# Patient Record
Sex: Female | Born: 1985 | Race: Black or African American | Hispanic: No | Marital: Single | Smoking: Former smoker
Health system: Southern US, Community
[De-identification: ages and names within clinical notes are randomized; demographics above are authoritative.]

## PROBLEM LIST (undated history)

## (undated) ENCOUNTER — Inpatient Hospital Stay: Payer: Self-pay

## (undated) DIAGNOSIS — F329 Major depressive disorder, single episode, unspecified: Secondary | ICD-10-CM

## (undated) DIAGNOSIS — F32A Depression, unspecified: Secondary | ICD-10-CM

## (undated) DIAGNOSIS — Z789 Other specified health status: Secondary | ICD-10-CM

## (undated) HISTORY — DX: Major depressive disorder, single episode, unspecified: F32.9

## (undated) HISTORY — PX: WISDOM TOOTH EXTRACTION: SHX21

## (undated) HISTORY — DX: Depression, unspecified: F32.A

---
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
## 2009-05-17 ENCOUNTER — Emergency Department: Payer: Self-pay | Admitting: Emergency Medicine

## 2009-09-30 ENCOUNTER — Ambulatory Visit: Payer: Self-pay | Admitting: Internal Medicine

## 2013-01-27 ENCOUNTER — Emergency Department: Payer: Self-pay | Admitting: Emergency Medicine

## 2014-04-10 ENCOUNTER — Emergency Department: Payer: Self-pay | Admitting: Emergency Medicine

## 2014-12-22 LAB — OB RESULTS CONSOLE HIV ANTIBODY (ROUTINE TESTING): HIV: NONREACTIVE

## 2014-12-23 ENCOUNTER — Other Ambulatory Visit: Payer: Self-pay | Admitting: Obstetrics and Gynecology

## 2014-12-23 DIAGNOSIS — Z369 Encounter for antenatal screening, unspecified: Secondary | ICD-10-CM

## 2014-12-24 LAB — OB RESULTS CONSOLE ABO/RH: RH Type: POSITIVE

## 2014-12-24 LAB — OB RESULTS CONSOLE VARICELLA ZOSTER ANTIBODY, IGG: Varicella: IMMUNE

## 2014-12-24 LAB — OB RESULTS CONSOLE ANTIBODY SCREEN: ANTIBODY SCREEN: NEGATIVE

## 2014-12-24 LAB — OB RESULTS CONSOLE HEPATITIS B SURFACE ANTIGEN: Hepatitis B Surface Ag: NEGATIVE

## 2014-12-24 LAB — OB RESULTS CONSOLE RUBELLA ANTIBODY, IGM: Rubella: IMMUNE

## 2015-01-08 ENCOUNTER — Ambulatory Visit: Payer: Self-pay | Attending: Obstetrics and Gynecology

## 2015-01-08 ENCOUNTER — Ambulatory Visit: Payer: Self-pay

## 2015-03-30 ENCOUNTER — Observation Stay
Admit: 2015-03-30 | Discharge: 2015-03-30 | Discharge status: Against Medical Advice | Payer: Medicaid Other | Attending: Obstetrics and Gynecology | Admitting: Obstetrics and Gynecology

## 2015-03-30 ENCOUNTER — Emergency Department
Admission: EM | Admit: 2015-03-30 | Discharge: 2015-03-30 | Disposition: A | Discharge status: Home / Self Care | Payer: Medicaid Other | Attending: Emergency Medicine | Admitting: Emergency Medicine

## 2015-03-30 ENCOUNTER — Encounter: Payer: Self-pay | Admitting: Emergency Medicine

## 2015-03-30 DIAGNOSIS — Z3A24 24 weeks gestation of pregnancy: Secondary | ICD-10-CM | POA: Diagnosis not present

## 2015-03-30 DIAGNOSIS — O9A212 Injury, poisoning and certain other consequences of external causes complicating pregnancy, second trimester: Secondary | ICD-10-CM | POA: Diagnosis present

## 2015-03-30 DIAGNOSIS — Y9289 Other specified places as the place of occurrence of the external cause: Secondary | ICD-10-CM | POA: Insufficient documentation

## 2015-03-30 DIAGNOSIS — Z3490 Encounter for supervision of normal pregnancy, unspecified, unspecified trimester: Secondary | ICD-10-CM

## 2015-03-30 DIAGNOSIS — Y9389 Activity, other specified: Secondary | ICD-10-CM | POA: Diagnosis not present

## 2015-03-30 DIAGNOSIS — S61012A Laceration without foreign body of left thumb without damage to nail, initial encounter: Secondary | ICD-10-CM | POA: Insufficient documentation

## 2015-03-30 DIAGNOSIS — Y998 Other external cause status: Secondary | ICD-10-CM | POA: Insufficient documentation
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
## 2015-03-30 NOTE — Progress Notes (Signed)
Patient requests to [facility]. AMA forms signed and patient informed of risks of leaving against medical advice. Patient not in distress and left ambulatory with significant other. Kalifa Cadden notified.
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
## 2015-06-09 ENCOUNTER — Encounter: Payer: Self-pay | Admitting: Emergency Medicine

## 2015-06-09 ENCOUNTER — Emergency Department
Admission: EM | Admit: 2015-06-09 | Discharge: 2015-06-09 | Disposition: A | Discharge status: Home / Self Care | Payer: Medicaid Other | Attending: Emergency Medicine | Admitting: Emergency Medicine

## 2015-06-09 DIAGNOSIS — O212 Late vomiting of pregnancy: Secondary | ICD-10-CM | POA: Diagnosis not present

## 2015-06-09 DIAGNOSIS — J0100 Acute maxillary sinusitis, unspecified: Secondary | ICD-10-CM | POA: Insufficient documentation

## 2015-06-09 DIAGNOSIS — Z3A34 34 weeks gestation of pregnancy: Secondary | ICD-10-CM | POA: Insufficient documentation

## 2015-06-09 DIAGNOSIS — O99513 Diseases of the respiratory system complicating pregnancy, third trimester: Secondary | ICD-10-CM | POA: Insufficient documentation

## 2015-06-09 DIAGNOSIS — O9989 Other specified diseases and conditions complicating pregnancy, childbirth and the puerperium: Secondary | ICD-10-CM | POA: Diagnosis present

## 2015-06-09 DIAGNOSIS — B9689 Other specified bacterial agents as the cause of diseases classified elsewhere: Secondary | ICD-10-CM

## 2015-06-09 DIAGNOSIS — M545 Low back pain: Secondary | ICD-10-CM | POA: Diagnosis not present

## 2015-06-09 DIAGNOSIS — J329 Chronic sinusitis, unspecified: Secondary | ICD-10-CM

## 2015-06-09 MED ORDER — AMOXICILLIN-POT CLAVULANATE 500-125 MG PO TABS: 1.0000 | ORAL_TABLET | Freq: Three times a day (TID) | ORAL | Status: DC

## 2015-06-09 NOTE — ED Notes (Signed)
Patient denies pain and is resting comfortably. , c/o cough

## 2015-06-09 NOTE — Discharge Instructions (Signed)

## 2015-06-09 NOTE — ED Provider Notes (Signed)
[facility] Emergency Department Provider Note  ____________________________________________  Time seen: Approximately 12:33 PM  I have reviewed the triage vital signs and the nursing notes.   HISTORY  Chief Complaint Cough and URI    HPI Virginia [name] is a 29 y.o. female [redacted] weeks pregnant, who presents with 3 weeks of cough and nasal congestion. 3 weeks ago she first developed a sore throat which has now resolved; however she continues to have a productive cough with yellow phlegm, nasal congestion worse on the left than the right. She reports occasional nosebleeds, as well as night sweats, but has not measured her temperature at home. She denies chills, hemoptysis, headache, vision changes, neck pain, tachycardia, or palpitations. She has tried Mucinex, Robitussin without alcohol, cough drops, and Vapor Rub with no relief. She tried a nasal spray for 2 days but was told to discontinue by her nurse midwife at [facility]. She also mentions low back pain that may be associated with her pregnancy.  She mentions that yesterday her midwife heard wheezing, and told her to come to the ER possibly for a breathing treatment or an x-ray to see if she has pneumonia. However, I reviewed the note that the patient brought with her, which reports no wheezing, and says to come to the ER if her symptoms worsen. The patient has no history of asthma or inhaler use. She is hoping for some sort of breathing treatment or something to help her "breathe better at night."   History reviewed. No pertinent past medical history.  There are no active problems to display for this patient.   History reviewed. No pertinent past surgical history.  Current Outpatient Rx  Name  Route  Sig  Dispense  Refill  . amoxicillin-clavulanate (AUGMENTIN) 500-125 MG tablet   Oral   Take 1 tablet (500 mg total) by mouth 3 (three) times daily.   20 tablet   0     Allergies Review of patient's allergies  indicates no known allergies.  No family history on file.  Social History Social History  Substance Use Topics  . Smoking status: Never Smoker   . Smokeless tobacco: None  . Alcohol Use: None    Review of Systems Constitutional: No fever/chills; reports occasional night sweats Eyes: No visual changes. ENT: Sore throat has resolved; no neck pain; reports nasal congestion; no ear drainage Cardiovascular: Denies chest pain. Respiratory: Reports shortness of breath. Gastrointestinal: Occasional vomiting; No abdominal pain.  No nausea.  No diarrhea.  No constipation. Genitourinary: Negative for dysuria. Musculoskeletal: Positive for low back pain. Skin: Negative for rash. Neurological: Negative for headaches, focal weakness or numbness.  10-point ROS otherwise negative.  ____________________________________________   PHYSICAL EXAM:  VITAL SIGNS: ED Triage Vitals  Enc Vitals Group     BP 06/09/15 1203 120/70 mmHg     Pulse Rate 06/09/15 1203 80     Resp 06/09/15 1203 18     Temp 06/09/15 1203 98.5 F (36.9 C)     Temp Source 06/09/15 1203 Oral     SpO2 06/09/15 1203 98 %     Weight 06/09/15 1203 219 lb (99.338 kg)     Height 06/09/15 1203  (1.549 m)     Head Cir --      Peak Flow --      Pain Score 06/09/15 1204 4     Pain Loc --      Pain Edu? --      Excl. in GC? --  Constitutional: Alert and oriented. Well appearing and in no acute distress. Eyes: Conjunctivae are normal. PERRL. EOMI. Head: Atraumatic. Nose: Swollen nasal turbinates; tender to palpation of the maxillary sinuses on the left than the right No congestion/rhinnorhea. Mouth/Throat: Mucous membranes are moist.  Oropharynx shows cobblestoning. Neck: No stridor.   Hematological/Lymphatic/Immunilogical: Positive for cervical lymphadenopathy. Cardiovascular: Normal rate, regular rhythm. Grossly normal heart sounds.  Good peripheral circulation. Respiratory: Normal respiratory effort.  No  retractions. Lungs CTAB. No wheezes. Gastrointestinal: Soft and nontender. No distention. Neurologic:  Normal speech and language. No gross focal neurologic deficits are appreciated. No gait instability. Skin:  Skin is warm, dry and intact. No rash noted. Psychiatric: Mood and affect are normal. Speech and behavior are normal.  ____________________________________________   LABS (all labs ordered are listed, but only abnormal results are displayed)  Labs Reviewed - No data to display ____________________________________________  EKG  None ____________________________________________  RADIOLOGY  None ____________________________________________   PROCEDURES  Procedure(s) performed: None  Critical Care performed: No  ____________________________________________   INITIAL IMPRESSION / ASSESSMENT AND PLAN / ED COURSE  Pertinent labs & imaging results that were available during my care of the patient were reviewed by me and considered in my medical decision making (see chart for details).  Acute bacterial sinusitis, given 3 week course, unilateral maxillary sinus pressure, and immunosuppression in pregnancy. Will prescribe Amoxicillin-clavulanate, Mucinex; counseled that a breathing treatment carries more risk than benefit at this time, especially given clear lung sounds and SpO2. ____________________________________________   FINAL CLINICAL IMPRESSION(S) / ED DIAGNOSES  Final diagnoses:  Sinusitis, bacterial  Acute maxillary sinusitis, recurrence not specified     Evangeline Dakinharles M Tequia Wolman, PA-C 06/09/15 1437  Sharman CheekPhillip Stafford, MD 06/09/15 1538

## 2015-06-09 NOTE — ED Notes (Signed)
Cough and chest congestion, wheezing x 3 weeks.  [redacted] weeks pregnant, referred to ED by PCP.  Has been taking mucinex and robitussion, but symptoms not improving.

## 2015-06-18 ENCOUNTER — Encounter: Payer: Self-pay | Admitting: *Deleted

## 2015-06-18 ENCOUNTER — Observation Stay
Admission: EM | Admit: 2015-06-18 | Discharge: 2015-06-18 | Disposition: A | Discharge status: Home / Self Care | Payer: Medicaid Other | Attending: Obstetrics and Gynecology | Admitting: Obstetrics and Gynecology

## 2015-06-18 DIAGNOSIS — M549 Dorsalgia, unspecified: Secondary | ICD-10-CM | POA: Diagnosis present

## 2015-06-18 DIAGNOSIS — Z3A36 36 weeks gestation of pregnancy: Secondary | ICD-10-CM | POA: Diagnosis not present

## 2015-06-18 DIAGNOSIS — O26893 Other specified pregnancy related conditions, third trimester: Principal | ICD-10-CM | POA: Insufficient documentation

## 2015-06-18 HISTORY — DX: Other specified health status: Z78.9

## 2015-06-18 MED ORDER — CALCIUM CARBONATE ANTACID 500 MG PO CHEW: 2.0000 | CHEWABLE_TABLET | ORAL | Status: DC | PRN

## 2015-06-18 MED ORDER — ACETAMINOPHEN 325 MG PO TABS: 650.0000 mg | ORAL_TABLET | ORAL | Status: DC | PRN

## 2015-06-18 NOTE — OB Triage Note (Signed)
Patient states she has been leaking a small amount of clear fluid since 1200 and has had "off and on" back pain for the past day

## 2015-06-18 NOTE — Discharge Instructions (Signed)
Drink plenty of fluid get plenty of rest, call your provider for any other concerns.

## 2015-06-22 LAB — OB RESULTS CONSOLE GBS: STREP GROUP B AG: POSITIVE

## 2015-06-22 LAB — OB RESULTS CONSOLE GC/CHLAMYDIA
CHLAMYDIA, DNA PROBE: NEGATIVE
GC PROBE AMP, GENITAL: NEGATIVE

## 2015-07-01 LAB — OB RESULTS CONSOLE RPR: RPR: NONREACTIVE

## 2015-07-07 ENCOUNTER — Observation Stay
Admission: EM | Admit: 2015-07-07 | Discharge: 2015-07-07 | Disposition: A | Discharge status: Home / Self Care | Payer: Medicaid Other | Attending: Obstetrics and Gynecology | Admitting: Obstetrics and Gynecology

## 2015-07-07 DIAGNOSIS — Z3A40 40 weeks gestation of pregnancy: Secondary | ICD-10-CM | POA: Insufficient documentation

## 2015-07-07 MED ORDER — OXYCODONE-ACETAMINOPHEN 5-325 MG PO TABS
1.0000 | ORAL_TABLET | ORAL | Status: DC | PRN
  Administered 2015-07-07: 1 via ORAL

## 2015-07-07 MED ORDER — OXYCODONE-ACETAMINOPHEN 5-325 MG PO TABS
ORAL_TABLET | ORAL | Status: AC
  Administered 2015-07-07: 1 via ORAL
  Filled 2015-07-07: qty 1

## 2015-07-07 NOTE — OB Triage Note (Signed)
Pt presents to L&D with c/o contractions, back pain and pressure.reports good fetal movement and Denies vaginal bleeding. efm and toco applied and explained, plan to observe fetal and maternal well being and assess for labor

## 2015-07-07 NOTE — Discharge Instructions (Signed)
Call provider or return to birthplace with: ° °1. Strong regular contractions every 5 minutes. °2. Leaking of fluid from your vagina °3. Vaginal bleeding: Bright red or heavy like a period °4. Decreased Fetal movement ° °

## 2015-07-11 ENCOUNTER — Other Ambulatory Visit: Payer: Self-pay | Admitting: Obstetrics and Gynecology

## 2015-07-11 ENCOUNTER — Telehealth: Payer: Self-pay | Admitting: Obstetrics and Gynecology

## 2015-07-11 NOTE — Telephone Encounter (Signed)
Pt had UTI and tx with Macrobid 100 mg po BID x 7 days. On Day 6, she presented back to clinic and another urine was sent. That showed the infection was still present. Disc with Carlean JewsMeredith Sigmon, CNM and she wanted to cover the pt if needed. The pt states she had improvement in symptoms in 3 days and no further symptoms. Since it is a holiday and office is closed, will see pt on Tues for a urine to see if we need to restart ABX. Pt agreed

## 2015-07-17 ENCOUNTER — Observation Stay
Admission: EM | Admit: 2015-07-17 | Discharge: 2015-07-17 | Disposition: A | Discharge status: Home / Self Care | Payer: Medicaid Other | Source: Home / Self Care | Admitting: Obstetrics and Gynecology

## 2015-07-17 DIAGNOSIS — Z3490 Encounter for supervision of normal pregnancy, unspecified, unspecified trimester: Secondary | ICD-10-CM

## 2015-07-17 MED ORDER — OXYCODONE-ACETAMINOPHEN 5-325 MG PO TABS
1.0000 | ORAL_TABLET | Freq: Once | ORAL | Status: AC
  Administered 2015-07-17: 1 via ORAL

## 2015-07-17 MED ORDER — OXYCODONE-ACETAMINOPHEN 5-325 MG PO TABS
ORAL_TABLET | ORAL | Status: AC
  Administered 2015-07-17: 1 via ORAL
  Filled 2015-07-17: qty 1

## 2015-07-17 NOTE — OB Triage Note (Signed)
Pt arrived to Obs rm 2 from ED with C/O contractions and possible water breaking. Pt placed on monitors and oriented to room.

## 2015-07-17 NOTE — Discharge Instructions (Signed)
Get plenty of rest and drink plenty of water

## 2015-07-18 ENCOUNTER — Inpatient Hospital Stay
Admission: EM | Admit: 2015-07-18 | Discharge: 2015-07-22 | DRG: 765 | Disposition: A | Discharge status: Home / Self Care | Payer: Medicaid Other | Attending: Obstetrics and Gynecology | Admitting: Obstetrics and Gynecology

## 2015-07-18 ENCOUNTER — Inpatient Hospital Stay: Payer: Medicaid Other | Admitting: Anesthesiology

## 2015-07-18 DIAGNOSIS — O411290 Chorioamnionitis, unspecified trimester, not applicable or unspecified: Secondary | ICD-10-CM | POA: Diagnosis present

## 2015-07-18 DIAGNOSIS — Z87891 Personal history of nicotine dependence: Secondary | ICD-10-CM

## 2015-07-18 DIAGNOSIS — O99824 Streptococcus B carrier state complicating childbirth: Secondary | ICD-10-CM | POA: Diagnosis present

## 2015-07-18 DIAGNOSIS — Z6841 Body Mass Index (BMI) 40.0 and over, adult: Secondary | ICD-10-CM

## 2015-07-18 DIAGNOSIS — Z3A40 40 weeks gestation of pregnancy: Secondary | ICD-10-CM

## 2015-07-18 DIAGNOSIS — O8612 Endometritis following delivery: Secondary | ICD-10-CM | POA: Diagnosis not present

## 2015-07-18 DIAGNOSIS — O329XX0 Maternal care for malpresentation of fetus, unspecified, not applicable or unspecified: Secondary | ICD-10-CM | POA: Diagnosis present

## 2015-07-18 DIAGNOSIS — O99210 Obesity complicating pregnancy, unspecified trimester: Secondary | ICD-10-CM | POA: Diagnosis present

## 2015-07-18 DIAGNOSIS — O480 Post-term pregnancy: Secondary | ICD-10-CM | POA: Diagnosis present

## 2015-07-18 DIAGNOSIS — O23593 Infection of other part of genital tract in pregnancy, third trimester: Secondary | ICD-10-CM | POA: Diagnosis present

## 2015-07-18 DIAGNOSIS — O99214 Obesity complicating childbirth: Secondary | ICD-10-CM | POA: Diagnosis present

## 2015-07-18 DIAGNOSIS — E669 Obesity, unspecified: Secondary | ICD-10-CM | POA: Diagnosis present

## 2015-07-18 DIAGNOSIS — O4292 Full-term premature rupture of membranes, unspecified as to length of time between rupture and onset of labor: Secondary | ICD-10-CM | POA: Diagnosis present

## 2015-07-18 DIAGNOSIS — Z98891 History of uterine scar from previous surgery: Secondary | ICD-10-CM

## 2015-07-18 LAB — CBC
HEMATOCRIT: 39.8 % (ref 35.0–47.0)
Hemoglobin: 13.4 g/dL (ref 12.0–16.0)
MCH: 29.3 pg (ref 26.0–34.0)
MCHC: 33.7 g/dL (ref 32.0–36.0)
MCV: 87.1 fL (ref 80.0–100.0)
PLATELETS: 194 10*3/uL (ref 150–440)
RBC: 4.57 MIL/uL (ref 3.80–5.20)
RDW: 13.5 % (ref 11.5–14.5)
WBC: 10.5 10*3/uL (ref 3.6–11.0)

## 2015-07-18 LAB — RPR: RPR Ser Ql: NONREACTIVE

## 2015-07-18 LAB — TYPE AND SCREEN
ABO/RH(D): B POS
ANTIBODY SCREEN: NEGATIVE

## 2015-07-18 LAB — ABO/RH: ABO/RH(D): B POS

## 2015-07-18 MED ORDER — MISOPROSTOL 200 MCG PO TABS
ORAL_TABLET | ORAL | Status: AC
  Filled 2015-07-18: qty 4

## 2015-07-18 MED ORDER — ACETAMINOPHEN 325 MG PO TABS: 650.0000 mg | ORAL_TABLET | ORAL | Status: DC | PRN

## 2015-07-18 MED ORDER — LIDOCAINE HCL (PF) 1 % IJ SOLN
INTRAMUSCULAR | Status: AC
  Filled 2015-07-18: qty 30

## 2015-07-18 MED ORDER — OXYTOCIN BOLUS FROM INFUSION: 500.0000 mL | INTRAVENOUS | Status: DC

## 2015-07-18 MED ORDER — NALBUPHINE HCL 10 MG/ML IJ SOLN: 5.0000 mg | Freq: Once | INTRAMUSCULAR | Status: DC | PRN

## 2015-07-18 MED ORDER — BUTORPHANOL TARTRATE 1 MG/ML IJ SOLN
1.0000 mg | INTRAMUSCULAR | Status: DC | PRN
  Administered 2015-07-18: 1 mg via INTRAVENOUS
  Filled 2015-07-18: qty 1

## 2015-07-18 MED ORDER — AMMONIA AROMATIC IN INHA
RESPIRATORY_TRACT | Status: AC
  Filled 2015-07-18: qty 10

## 2015-07-18 MED ORDER — OXYTOCIN 40 UNITS IN LACTATED RINGERS INFUSION - SIMPLE MED
1.0000 m[IU]/min | INTRAVENOUS | Status: DC
  Administered 2015-07-18: 1 m[IU]/min via INTRAVENOUS

## 2015-07-18 MED ORDER — SODIUM CHLORIDE 0.9 % IV SOLN
INTRAVENOUS | Status: DC | PRN
  Administered 2015-07-18 (×2): 5 mL via EPIDURAL

## 2015-07-18 MED ORDER — LIDOCAINE-EPINEPHRINE (PF) 1.5 %-1:200000 IJ SOLN
INTRAMUSCULAR | Status: DC | PRN
  Administered 2015-07-18: 3 mL via EPIDURAL

## 2015-07-18 MED ORDER — OXYTOCIN 40 UNITS IN LACTATED RINGERS INFUSION - SIMPLE MED: 1.0000 m[IU]/min | INTRAVENOUS | Status: DC

## 2015-07-18 MED ORDER — FENTANYL 2.5 MCG/ML W/ROPIVACAINE 0.2% IN NS 100 ML EPIDURAL INFUSION (ARMC-ANES)
10.0000 mL/h | EPIDURAL | Status: DC
  Administered 2015-07-18 – 2015-07-19 (×3): 10 mL/h via EPIDURAL
  Filled 2015-07-18 (×3): qty 100

## 2015-07-18 MED ORDER — NALBUPHINE HCL 10 MG/ML IJ SOLN: 5.0000 mg | INTRAMUSCULAR | Status: DC | PRN

## 2015-07-18 MED ORDER — KETOROLAC TROMETHAMINE 30 MG/ML IJ SOLN: 30.0000 mg | Freq: Four times a day (QID) | INTRAMUSCULAR | Status: AC | PRN

## 2015-07-18 MED ORDER — SODIUM CHLORIDE 0.9 % IV SOLN
1.0000 g | INTRAVENOUS | Status: DC
  Administered 2015-07-18 – 2015-07-19 (×8): 1 g via INTRAVENOUS
  Filled 2015-07-18 (×13): qty 1000

## 2015-07-18 MED ORDER — OXYTOCIN 10 UNIT/ML IJ SOLN
INTRAMUSCULAR | Status: DC
Start: 2015-07-18 — End: 2015-07-19
  Filled 2015-07-18: qty 2

## 2015-07-18 MED ORDER — DIPHENHYDRAMINE HCL 25 MG PO CAPS: 25.0000 mg | ORAL_CAPSULE | ORAL | Status: DC | PRN

## 2015-07-18 MED ORDER — NALOXONE HCL 2 MG/2ML IJ SOSY
1.0000 ug/kg/h | PREFILLED_SYRINGE | INTRAVENOUS | Status: DC | PRN
  Filled 2015-07-18: qty 2

## 2015-07-18 MED ORDER — DIPHENHYDRAMINE HCL 50 MG/ML IJ SOLN: 12.5000 mg | INTRAMUSCULAR | Status: DC | PRN

## 2015-07-18 MED ORDER — OXYTOCIN 40 UNITS IN LACTATED RINGERS INFUSION - SIMPLE MED
2.5000 [IU]/h | INTRAVENOUS | Status: DC
  Filled 2015-07-18: qty 1000

## 2015-07-18 MED ORDER — SODIUM CHLORIDE 0.9 % IV SOLN
2.0000 g | Freq: Once | INTRAVENOUS | Status: AC
  Administered 2015-07-18: 2 g via INTRAVENOUS
  Filled 2015-07-18: qty 2000

## 2015-07-18 MED ORDER — MEPERIDINE HCL 25 MG/ML IJ SOLN: 6.2500 mg | INTRAMUSCULAR | Status: DC | PRN

## 2015-07-18 MED ORDER — FAMOTIDINE 20 MG PO TABS
10.0000 mg | ORAL_TABLET | Freq: Every day | ORAL | Status: DC
  Administered 2015-07-18 – 2015-07-22 (×4): 10 mg via ORAL
  Filled 2015-07-18 (×4): qty 1

## 2015-07-18 MED ORDER — FENTANYL 2.5 MCG/ML W/ROPIVACAINE 0.2% IN NS 100 ML EPIDURAL INFUSION (ARMC-ANES)
EPIDURAL | Status: AC
  Administered 2015-07-18: 10 mL/h via EPIDURAL
  Filled 2015-07-18: qty 100

## 2015-07-18 MED ORDER — LACTATED RINGERS IV SOLN
INTRAVENOUS | Status: DC
  Administered 2015-07-18 – 2015-07-19 (×4): via INTRAVENOUS

## 2015-07-18 MED ORDER — SODIUM CHLORIDE 0.9 % IJ SOLN: 3.0000 mL | INTRAMUSCULAR | Status: DC | PRN

## 2015-07-18 MED ORDER — SODIUM CHLORIDE 0.9 % IJ SOLN
INTRAMUSCULAR | Status: AC
  Filled 2015-07-18: qty 10

## 2015-07-18 MED ORDER — CITRIC ACID-SODIUM CITRATE 334-500 MG/5ML PO SOLN
30.0000 mL | ORAL | Status: DC | PRN
Start: 2015-07-18 — End: 2015-07-19
  Filled 2015-07-18: qty 15

## 2015-07-18 MED ORDER — ONDANSETRON HCL 4 MG/2ML IJ SOLN
4.0000 mg | Freq: Four times a day (QID) | INTRAMUSCULAR | Status: DC | PRN
Start: 2015-07-18 — End: 2015-07-19

## 2015-07-18 MED ORDER — NALOXONE HCL 0.4 MG/ML IJ SOLN: 0.4000 mg | INTRAMUSCULAR | Status: DC | PRN

## 2015-07-18 MED ORDER — ONDANSETRON HCL 4 MG/2ML IJ SOLN: 4.0000 mg | Freq: Three times a day (TID) | INTRAMUSCULAR | Status: DC | PRN

## 2015-07-18 MED ORDER — LACTATED RINGERS IV SOLN
500.0000 mL | INTRAVENOUS | Status: DC | PRN
  Administered 2015-07-19: 14:00:00 via INTRAVENOUS

## 2015-07-18 NOTE — Progress Notes (Signed)
S: Resting comfortably with epidural. + CTX, no LOF, VB O: Filed Vitals:   07/18/15 0724 07/18/15 0820 07/18/15 0920 07/18/15 1001  BP:  119/72 123/76   Pulse:  78 98   Temp: 99.5 F (37.5 C) 98.6 F (37 C)  98.8 F (37.1 C)  TempSrc: Oral Axillary  Axillary  Resp:   16   Height:      Weight:      SpO2:        Gen: NAD, AAOx3      Abd: FNTTP      Ext: Non-tender, Nonedmeatous    FHT: + mod var + accelerations no decelerations TOCO: Q 7-8  min SVE: 3/80%/vtx-2/-1   A/P:  30 y.o. yo G1P0 at 3728w1d for SROM with mec for labor.   Labor: Latent phase, Pitocin at 3 mun/min  FWB: Reassuring Cat 1 tracing. EFW 7#14oz  GBS:+, ABX per orders  Contraception: Micronor   Sharee Pimplearon W Aziya Arena 10:12 AM

## 2015-07-18 NOTE — Anesthesia Procedure Notes (Signed)
Epidural Patient location during procedure: OB Start time: 07/18/2015 2:16 AM End time: 07/18/2015 2:26 AM  Staffing Anesthesiologist: Lenard SimmerKARENZ, Tallen Schnorr Performed by: anesthesiologist   Preanesthetic Checklist Completed: patient identified, site marked, surgical consent, pre-op evaluation, timeout performed, IV checked, risks and benefits discussed and monitors and equipment checked  Epidural Patient position: sitting Prep: ChloraPrep Patient monitoring: heart rate, continuous pulse ox and blood pressure Approach: midline Location: L4-L5 Injection technique: LOR saline  Needle:  Needle type: Tuohy  Needle gauge: 18 G Needle length: 9 cm and 9 Needle insertion depth: 5.5 cm Catheter type: closed end flexible Catheter size: 20 Guage Catheter at skin depth: 11 cm Test dose: negative and 1.5% lidocaine with Epi 1:200 K  Assessment Events: blood not aspirated, injection not painful, no injection resistance, negative IV test and no paresthesia  Additional Notes   Patient tolerated the insertion well without complications.Reason for block:procedure for pain

## 2015-07-18 NOTE — Discharge Summary (Signed)
Obstetric Discharge Summary   Patient ID: Virginia MorrowRuvonia L [name] MRN: [id number] DOB/AGE: 29/12/1985 28 y.o.   Date of Admission: 07/18/2015  Date of Discharge: 1/  /17  Admitting Diagnosis: Onset of Labor at 6735w1d  Secondary Diagnosis: Obesity  Mode of Delivery: LTCS for fail to progress     Discharge Diagnosis: LTCS for fail to progress, postpartum endometritis   Intrapartum Procedures: pitocin drip for augmentation   Post partum procedures: none  Complications: None   Brief Hospital Course   POD#3 (Cesarean Section): Virginia [name] is a G1P0 who underwent cesarean section on 07/19/15.  Patient had an uncomplicated surgery; for further details of this surgery, please refer to the operative note.  Patient had postpartum course complicated by endometritis, with Tmax of 102.6 on POD#1.  She was started on amp/gent with WBC trending down.  By time of discharge on POD#3, her pain was controlled on oral pain medications; she had appropriate lochia and was ambulating, voiding without difficulty, tolerating regular diet and passing flatus.   She was deemed stable for discharge to home.    Labs: CBC Latest Ref Rng 07/18/2015  WBC 3.6 - 11.0 K/uL 10.5  Hemoglobin 12.0 - 16.0 g/dL 78.213.4  Hematocrit 95.635.0 - 47.0 % 39.8  Platelets 150 - 440 K/uL 194   B POS  Physical exam:  Blood pressure 123/76, pulse 98, temperature 98.6 F (37 C), temperature source Axillary, resp. rate 16, height 5\' 1"  (1.549 m), weight 232 lb (105.235 kg), SpO2 99 %. General: alert and no distress, eyes non-icteric Heart: S1S2, RRR, No M/R/G. Lungs: CTA bilat, no W/R/R. Lochia: appropriate Abdomen: soft, NT Uterine Fundus: firm Incision: healing well, no significant drainage, no dehiscence, no significant erythema. Staples intact and On-Q pump intact. Extremities: No evidence of DVT seen on physical exam. No lower extremity edema.  Discharge Instructions: Per After Visit Summary. Activity: Advance as tolerated. Pelvic  rest for 6 weeks.  Also refer to After Visit Summary Diet: Regular Medications:   Medication List    ASK your doctor about these medications        amoxicillin-clavulanate 500-125 MG tablet  Commonly known as:  AUGMENTIN  Take 1 tablet (500 mg total) by mouth 3 (three) times daily.     multivitamin-prenatal 27-0.8 MG Tabs tablet  Take 1 tablet by mouth daily at 12 noon.       Outpatient follow up:  Postpartum contraception: Micronor  Discharged Condition: Stable  Discharged to: home    Newborn Data: Stable  Baby "Girl" named "Arman BogusJaniyah  Noelle"  Disposition:home with mother  Apgars: APGAR (1 MIN): 7 APGAR (5 MINS): 9 APGAR (10 MINS): 9  Baby Feeding: Breast  Sharee Pimplearon W Jones, CNM 07/18/2015

## 2015-07-18 NOTE — Progress Notes (Signed)
S: Resting comfortably with epiidural. + CTX, no LOF, VB. Pt is getting tired.  O: Filed Vitals:   07/18/15 1524 07/18/15 1620 07/18/15 1720 07/18/15 1821  BP:  116/58 124/72 121/48  Pulse:  77 77 80  Temp: 98.8 F (37.1 C)  98.8 F (37.1 C)   TempSrc: Axillary  Oral   Resp:  14 14 16   Height:      Weight:      SpO2:       Gen: NAD, AAOx3      Abd: FNTTP      Ext: Non-tender, Nonedmeatous    FHT: + mod var + accelerations no decelerations, Cat I, few early decels earlier TOCO: Q 2.5-3 min, 240 MVU's SVE: 5/90/vtx-1   A/P:  30 y.o. yo G1P0 at 2642w1d for Pitocin augmentation and prolonged latent phase of labor.   Labor: prolonged latent phase with increasing Pitocin up to 30 mun/min  FWB: Reassuring Cat 1 tracing.  GBS: pos  Contraception:Micronor  Dr Dalbert GarnetBeasley aware of pt status and agrees with plan of care   Sharee Pimplearon W Chance Karam 9:47 PM

## 2015-07-18 NOTE — Progress Notes (Signed)
Tomasita MorrowRuvonia L Mood is a 30 y.o. G1P0 at 4851w1d admitted with PROM fro meconium stained fluid on 07/17/15 at 2300, approx 24hrs ago.  Review of labor course notes the patient is not in active labor, despite pitocin titration. She is comfortable with her epidural and is afebrile. She has an IUPC with inadequate contractions.   FHT showed several late decelerations two hours ago that resolved with conservative measures. The baby's status now is reassuring.  Objective: BP 129/77 mmHg  Pulse 73  Temp(Src) 98.8 F (37.1 C) (Oral)  Resp 16  Ht 5\' 1"  (1.549 m)  Wt 105.235 kg (232 lb)  BMI 43.86 kg/m2  SpO2 99% I/O last 3 completed shifts: In: 249 [I.V.:249] Out: 775 [Urine:775]    FHT:  Cat I with mod var, +accels, no decels. UC:  Regular contractions with pitocin at 6728mu/min (maximun 30 per protocol) with inadequate mvus of 180.  SVE:   Dilation: 5 Effacement (%): 90 Station: -1 Exam by:: C.Jones,CNM  Labs: Lab Results  Component Value Date   WBC 10.5 07/18/2015   HGB 13.4 07/18/2015   HCT 39.8 07/18/2015   MCV 87.1 07/18/2015   PLT 194 07/18/2015    Assessment / Plan:  At 5 cm, the patient is not in active labor. [facility]'s [facility], and we will continuously monitor. There are no maternal or fetal signs of infection.  She is receiving Amp for GBS positivity.   Will continue induction with pitocin to maximum of 30 mu/min per protocol and hope for a vaginal delivery. Consideration for pitocin rest by CNM at bedside if clinically appropriate.   Will treat if signs of intra amniotic infection develop.   Patient is aware of clinical status.  Christeen DouglasBEASLEY, Janziel Hockett 07/18/2015, 10:07 PM

## 2015-07-18 NOTE — Progress Notes (Signed)
S: Resting comfortably withepidural. + CTX, no LOF, VB O: Filed Vitals:   07/18/15 1102 07/18/15 1120 07/18/15 1147 07/18/15 1220  BP:  124/68  112/65  Pulse:  81  80  Temp: 99 F (37.2 C)     TempSrc: Oral     Resp:  14 14 16   Height:      Weight:      SpO2:         Gen: NAD, AAOx3      Abd: FNTTP      Ext: Non-tender, Nonedmeatous    FHT +  mod var + accelerations no decelerations TOCO: Q 4 min, 40-50 MVU's, pt not in active labor yet SVE: not reexamined to prevent infection   A/P:  30 y.o. yo G1P0 at 6421w1d for delivery with augmentation.  Labor: Latent phase  FWB: Reassuring Cat 1 tracing. EFW 7#140z  GBS:+ ABX being given  Contraception: unknown   Sharee Pimplearon W Fina Heizer 12:33 PM

## 2015-07-18 NOTE — Progress Notes (Signed)
S: Resting comfortably with effectiive epidural. + CTX, no LOF, VB O: Filed Vitals:   07/18/15 1403 07/18/15 1420 07/18/15 1520 07/18/15 1524  BP:  122/72 125/85   Pulse:  90 100   Temp: 98.1 F (36.7 C)   98.8 F (37.1 C)  TempSrc: Axillary   Axillary  Resp:  15    Height:      Weight:      SpO2:       Gen: NAD, AAOx3      Abd: FNTTP      Ext: Non-tender, Nonedmeatous    FHT: +mod var + accelerations no decelerations, Cat I TOCO: Q min SVE: 4/90/vtx-1   A/P:  30 y.o. yo G1P0 at 5221w1d for  Delivery   Labor: latent phase and MVU's still ineffective at 180  FWB: Reassuring Cat 1 tracing.  GBS: + on ABX  Continue to monitor UC/FHT's  Pitocin at 20 mun/min   Sharee Pimplearon W Purl Claytor 3:40 PM

## 2015-07-18 NOTE — OB Triage Note (Signed)
Patient presented to L&D complaining of contractions that have lasted all of yesterday that have gotten worse throughout the day.  States she thinks her water broke around 2300. Nitrazine positive- meconium stained fluid present on peri pad.  Denies decreased fetal movement or vaginal bleeding.

## 2015-07-18 NOTE — Anesthesia Preprocedure Evaluation (Signed)
Anesthesia Evaluation  Patient identified by MRN, date of birth, ID band Patient awake    Reviewed: Allergy & Precautions, H&P , NPO status , Patient's Chart, lab work & pertinent test results, reviewed documented beta blocker date and time   History of Anesthesia Complications Negative for: history of anesthetic complications  Airway Mallampati: III  TM Distance: >3 FB Neck ROM: full    Dental no notable dental hx. (+) Teeth Intact   Pulmonary neg pulmonary ROS, former smoker,    Pulmonary exam normal breath sounds clear to auscultation       Cardiovascular Exercise Tolerance: Good negative cardio ROS Normal cardiovascular exam Rhythm:regular Rate:Normal     Neuro/Psych negative neurological ROS  negative psych ROS   GI/Hepatic negative GI ROS, Neg liver ROS,   Endo/Other  negative endocrine ROS  Renal/GU negative Renal ROS  negative genitourinary   Musculoskeletal   Abdominal   Peds  Hematology negative hematology ROS (+)   Anesthesia Other Findings Past Medical History:   Medical history non-contributory                             Reproductive/Obstetrics (+) Pregnancy                             Anesthesia Physical Anesthesia Plan  ASA: II  Anesthesia Plan: Epidural   Post-op Pain Management:    Induction:   Airway Management Planned:   Additional Equipment:   Intra-op Plan:   Post-operative Plan:   Informed Consent: I have reviewed the patients History and Physical, chart, labs and discussed the procedure including the risks, benefits and alternatives for the proposed anesthesia with the patient or authorized representative who has indicated his/her understanding and acceptance.   Dental Advisory Given  Plan Discussed with: Anesthesiologist, CRNA and Surgeon  Anesthesia Plan Comments:         Anesthesia Quick Evaluation

## 2015-07-18 NOTE — Progress Notes (Signed)
Dr Bernestine AmassBeasely called and aware of pt status and agreed with proceeding even though progress is slow. Pt is afebrile, fetal wellbeing is stable and position is OP and using peanut ball to rotate side to side to attempt to get the baby down in the pelvis. Pt also does not want a LTCS also. Her partner is getting frustrated with not having the baby yet. Considered halving the pitocin but, will try to use the side to side roll with the peanut ball. Will check pt at 2330.

## 2015-07-18 NOTE — H&P (Signed)
HISTORY AND PHYSICAL  HISTORY OF PRESENT ILLNESS: Ms. Virginia [name] is a 30 y.o. G1P0 at 4639w1d by LMP of 10/13/14 consistent with 20 week ultrasound with a pregnancy complicated by  Obesity presenting for labor S/S and SROM at 2300 with meconium noted.   She has been having contractions Q 4-5 and confirms leakage of fluid meconium stained,no  vaginal bleeding, or decreased fetal movement.   [facility]  Significant for Primip and Obeisty. Pt was a smoker of tobacco products but, quit with the pregnancy. Smoked 2.5 pack per year.   REVIEW OF SYSTEMS: A complete review of systems was performed and was specifically negative for headache, changes in vision, RUQ pain, shortness of breath, chest pain, lower extremity edema and dysuria.   HISTORY:  Past Medical History  Diagnosis Date  . Medical history non-contributory     Past Surgical History  Procedure Laterality Date  . No past surgeries      No current facility-administered medications on file prior to encounter.   Current Outpatient Prescriptions on File Prior to Encounter  Medication Sig Dispense Refill  . amoxicillin-clavulanate (AUGMENTIN) 500-125 MG tablet Take 1 tablet (500 mg total) by mouth 3 (three) times daily. 20 tablet 0  . Prenatal Vit-Fe Fumarate-FA (MULTIVITAMIN-PRENATAL) 27-0.8 MG TABS tablet Take 1 tablet by mouth daily at 12 noon.       No Known Allergies  OB History  Gravida Para Term Preterm AB SAB TAB Ectopic Multiple Living  1             # Outcome Date GA Lbr Len/2nd Weight Sex Delivery Anes PTL Lv  1 Current               Gynecologic History: BV + in pregnancy History of Abnormal Pap Smear: Neg History of STI: Neg  Social History  Substance Use Topics  . Smoking status: Former Games developermoker  . Smokeless tobacco: None  . Alcohol Use: No    PHYSICAL EXAM: Temp:  [98.1 F (36.7 C)-99.5 F (37.5 C)] 98.6 F (37 C) (01/07 0820) Pulse Rate:  [74-109] 78 (01/07 0820) Resp:  [14-36] 14 (01/07 0720) BP:  (109-151)/(61-88) 119/72 mmHg (01/07 0820) SpO2:  [98 %-100 %] 99 % (01/07 0330) Weight:  [232 lb (105.235 kg)] 232 lb (105.235 kg) (01/07 0104)  GENERAL: NAD AAOx3 CHEST:CTAB no increased work of breathing CV:RRR no appreciable murmurs, rubs, gallops ABDOMEN: gravid, nontender, EFW 7#14  by [place] EXTREMITIES:  Warm and well-perfused, nontender, nonedematous, 1+ DTRs 0clonus CERVIX: 3/ 80/vtx-1/-2  FHT:150s baseline with mod variability, +accelerations and no decelerations  Toco:q 7-8 mins  DIAGNOSTIC STUDIES:  Recent Labs Lab 07/18/15 0112  WBC 10.5  HGB 13.4  HCT 39.8  PLT 194    PRENATAL STUDIES:  Prenatal Labs:  MBT:  B pos; Rubella immune, Varicella immune, HIV neg, RPR neg, Hep B neg, GC/CT neg, GBS pos glucola WNL  Last US  wks 19,placenta  Ant above the osl, normal anatomy, AFI WNL  ASSESSMENT AND PLAN:  1. Fetal Well being  - Fetal Tracing: reassuring - Ultrasound:  reviewed, as above - Group B Streptococcus: pos - Presentation: vtx confirmed by CNM  2. Routine OB: - Prenatal labs reviewed, as above - Rh B pos  3.  Labor: Needs augmentation -  Contractions internal toco in place -  Pelvis proven to none -  Plan for augmentation with Pitocin per protocol  4. Post Partum Planning: - Infant feeding: Breast - Contraception: unsure

## 2015-07-18 NOTE — Progress Notes (Addendum)
S: Resting comfortably with epidural. + CTX, no LOF, VB O: Filed Vitals:   07/18/15 1520 07/18/15 1524 07/18/15 1620 07/18/15 1720  BP: 125/85  116/58 124/72  Pulse: 100  77 77  Temp:  98.8 F (37.1 C)    TempSrc:  Axillary    Resp:   14 14  Height:      Weight:      SpO2:        Gen: NAD, AAOx3      Abd: FNTTP      Ext: Non-tender, Nonedmeatous    FHT: +marked to mod var + accelerations no decelerations, Cat I TOCO: Q 2  Min, 180 MVU'd. Will titrate Pitocin up to 30 mu/min SVE: 4-5/90/vtx-1   A/P:  30 y.o. yo G1P0 at 199w1d for delivery.  Labor: Pitocin augmentation at 20 mun/min  FWB: Reassuring Cat 1 tracing. EFW  7#14oz  GBS: pos  Continue to monitor UC/FHT and VS.  Increase Pitocin up to 30 mun/min  Dr Dalbert GarnetBeasley updated and aware of pt status and agrees with plan of care   Sharee Pimplearon W Alea Ryer 5:35 PM

## 2015-07-18 NOTE — Progress Notes (Signed)
S: Resting comfortably with effective epidural. + CTX, no LOF, VB O: Filed Vitals:   07/18/15 1524 07/18/15 1620 07/18/15 1720 07/18/15 1821  BP:  116/58 124/72 121/48  Pulse:  77 77 80  Temp: 98.8 F (37.1 C)  98.8 F (37.1 C)   TempSrc: Axillary  Oral   Resp:  14 14 16   Height:      Weight:      SpO2:       Gen: NAD, AAOx3      Abd: FNTTP      Ext: Non-tender, Nonedmeatous    FHT: +mod var + accelerations no decelerations, Cat I TOCO: Q 2-3 mins, MVU's 230 SVE: Last exam: 4-5/90%/vtx-1   A/P:  30 y.o. yo G1P0 at 3523w1d for delivery due to SROM and on Pitocin.    Labor: being augmented  FWB: Reassuring Cat 1 tracing. EFW 7#14oz  GBS: pos, ABX being given    Sharee Pimplearon W Jones 8:25 PM

## 2015-07-19 ENCOUNTER — Encounter
Admission: EM | Disposition: A | Discharge status: Home / Self Care | Payer: Self-pay | Source: Home / Self Care | Attending: Obstetrics and Gynecology

## 2015-07-19 ENCOUNTER — Encounter: Payer: Self-pay | Admitting: *Deleted

## 2015-07-19 DIAGNOSIS — Z98891 History of uterine scar from previous surgery: Secondary | ICD-10-CM

## 2015-07-19 LAB — URINALYSIS COMPLETE WITH MICROSCOPIC (ARMC ONLY)
BACTERIA UA: NONE SEEN
Bilirubin Urine: NEGATIVE
GLUCOSE, UA: NEGATIVE mg/dL
Leukocytes, UA: NEGATIVE
NITRITE: NEGATIVE
PROTEIN: NEGATIVE mg/dL
SPECIFIC GRAVITY, URINE: 1.01 (ref 1.005–1.030)
SQUAMOUS EPITHELIAL / LPF: NONE SEEN
pH: 7 (ref 5.0–8.0)

## 2015-07-19 LAB — CBC WITH DIFFERENTIAL/PLATELET
BASOS ABS: 0.1 10*3/uL (ref 0–0.1)
BASOS PCT: 0 %
Basophils Absolute: 0 10*3/uL (ref 0–0.1)
Basophils Relative: 1 %
EOS PCT: 0 %
Eosinophils Absolute: 0 10*3/uL (ref 0–0.7)
Eosinophils Absolute: 0 10*3/uL (ref 0–0.7)
Eosinophils Relative: 0 %
HEMATOCRIT: 38.7 % (ref 35.0–47.0)
HEMATOCRIT: 32.4 % — AB (ref 35.0–47.0)
HEMOGLOBIN: 13 g/dL (ref 12.0–16.0)
Hemoglobin: 10.7 g/dL — ABNORMAL LOW (ref 12.0–16.0)
LYMPHS ABS: 1.6 10*3/uL (ref 1.0–3.6)
LYMPHS PCT: 11 %
Lymphocytes Relative: 11 %
Lymphs Abs: 1.3 10*3/uL (ref 1.0–3.6)
MCH: 29.6 pg (ref 26.0–34.0)
MCH: 28.9 pg (ref 26.0–34.0)
MCHC: 33.6 g/dL (ref 32.0–36.0)
MCHC: 32.9 g/dL (ref 32.0–36.0)
MCV: 88.2 fL (ref 80.0–100.0)
MCV: 87.8 fL (ref 80.0–100.0)
MONO ABS: 1.1 10*3/uL — AB (ref 0.2–0.9)
MONOS PCT: 8 %
Monocytes Absolute: 1 10*3/uL — ABNORMAL HIGH (ref 0.2–0.9)
Monocytes Relative: 9 %
NEUTROS ABS: 9.1 10*3/uL — AB (ref 1.4–6.5)
NEUTROS ABS: 11.4 10*3/uL — AB (ref 1.4–6.5)
NEUTROS PCT: 79 %
Neutrophils Relative %: 81 %
PLATELETS: 187 10*3/uL (ref 150–440)
Platelets: 159 10*3/uL (ref 150–440)
RBC: 4.39 MIL/uL (ref 3.80–5.20)
RBC: 3.7 MIL/uL — ABNORMAL LOW (ref 3.80–5.20)
RDW: 13.7 % (ref 11.5–14.5)
RDW: 13.4 % (ref 11.5–14.5)
WBC: 11.5 10*3/uL — AB (ref 3.6–11.0)
WBC: 14.1 10*3/uL — ABNORMAL HIGH (ref 3.6–11.0)

## 2015-07-19 LAB — BASIC METABOLIC PANEL
ANION GAP: 7 (ref 5–15)
BUN: 7 mg/dL (ref 6–20)
CHLORIDE: 107 mmol/L (ref 101–111)
CO2: 23 mmol/L (ref 22–32)
Calcium: 8.3 mg/dL — ABNORMAL LOW (ref 8.9–10.3)
Creatinine, Ser: 0.79 mg/dL (ref 0.44–1.00)
Glucose, Bld: 111 mg/dL — ABNORMAL HIGH (ref 65–99)
POTASSIUM: 3.5 mmol/L (ref 3.5–5.1)
SODIUM: 137 mmol/L (ref 135–145)

## 2015-07-19 SURGERY — Surgical Case
Anesthesia: Epidural

## 2015-07-19 SURGERY — Surgical Case

## 2015-07-19 MED ORDER — SIMETHICONE 80 MG PO CHEW
80.0000 mg | CHEWABLE_TABLET | ORAL | Status: DC | PRN
  Administered 2015-07-20 – 2015-07-21 (×2): 80 mg via ORAL
  Filled 2015-07-19: qty 1

## 2015-07-19 MED ORDER — OXYTOCIN 40 UNITS IN LACTATED RINGERS INFUSION - SIMPLE MED: 1.0000 m[IU]/min | INTRAVENOUS | Status: DC

## 2015-07-19 MED ORDER — BUPIVACAINE HCL (PF) 0.5 % IJ SOLN
INTRAMUSCULAR | Status: AC
  Filled 2015-07-19: qty 30

## 2015-07-19 MED ORDER — GENTAMICIN SULFATE 40 MG/ML IJ SOLN
Freq: Three times a day (TID) | INTRAVENOUS | Status: DC
  Administered 2015-07-20 – 2015-07-21 (×5): via INTRAVENOUS
  Filled 2015-07-19 (×7): qty 4

## 2015-07-19 MED ORDER — FLEET ENEMA 7-19 GM/118ML RE ENEM: 1.0000 | ENEMA | Freq: Every day | RECTAL | Status: DC | PRN

## 2015-07-19 MED ORDER — MENTHOL 3 MG MT LOZG
1.0000 | LOZENGE | OROMUCOSAL | Status: DC | PRN
  Filled 2015-07-19: qty 9

## 2015-07-19 MED ORDER — GENTAMICIN SULFATE 40 MG/ML IJ SOLN
Freq: Once | INTRAVENOUS | Status: AC
  Administered 2015-07-19: 23:00:00 via INTRAVENOUS
  Filled 2015-07-19: qty 4

## 2015-07-19 MED ORDER — BUPIVACAINE 0.25 % ON-Q PUMP DUAL CATH 400 ML
400.0000 mL | INJECTION | Status: DC
  Filled 2015-07-19: qty 400

## 2015-07-19 MED ORDER — PRENATAL MULTIVITAMIN CH
1.0000 | ORAL_TABLET | Freq: Every day | ORAL | Status: DC
  Administered 2015-07-20 – 2015-07-22 (×3): 1 via ORAL
  Filled 2015-07-19 (×3): qty 1

## 2015-07-19 MED ORDER — BISACODYL 10 MG RE SUPP: 10.0000 mg | Freq: Every day | RECTAL | Status: DC | PRN

## 2015-07-19 MED ORDER — DIBUCAINE 1 % RE OINT: 1.0000 "application " | TOPICAL_OINTMENT | RECTAL | Status: DC | PRN

## 2015-07-19 MED ORDER — CITRIC ACID-SODIUM CITRATE 334-500 MG/5ML PO SOLN
30.0000 mL | Freq: Once | ORAL | Status: AC
  Administered 2015-07-19: 30 mL via ORAL

## 2015-07-19 MED ORDER — LANOLIN HYDROUS EX OINT: 1.0000 "application " | TOPICAL_OINTMENT | CUTANEOUS | Status: DC | PRN

## 2015-07-19 MED ORDER — BUPIVACAINE HCL (PF) 0.5 % IJ SOLN
INTRAMUSCULAR | Status: DC | PRN
  Administered 2015-07-19: 30 mL

## 2015-07-19 MED ORDER — OXYTOCIN 40 UNITS IN LACTATED RINGERS INFUSION - SIMPLE MED
1.0000 m[IU]/min | INTRAVENOUS | Status: DC
  Administered 2015-07-19: 100 mL via INTRAVENOUS
  Filled 2015-07-19 (×3): qty 1000

## 2015-07-19 MED ORDER — OXYCODONE HCL 5 MG PO TABS
5.0000 mg | ORAL_TABLET | ORAL | Status: DC | PRN
  Filled 2015-07-19: qty 1

## 2015-07-19 MED ORDER — LIDOCAINE HCL (PF) 2 % IJ SOLN
INTRAMUSCULAR | Status: DC | PRN
  Administered 2015-07-19 (×2): 5 mL via INTRADERMAL
  Administered 2015-07-19: 10 mL via INTRADERMAL
  Administered 2015-07-19: 5 mL via INTRADERMAL

## 2015-07-19 MED ORDER — CEFAZOLIN SODIUM-DEXTROSE 2-3 GM-% IV SOLR
2.0000 g | Freq: Once | INTRAVENOUS | Status: AC
  Administered 2015-07-19: 2 g via INTRAVENOUS
  Filled 2015-07-19: qty 50

## 2015-07-19 MED ORDER — SENNOSIDES-DOCUSATE SODIUM 8.6-50 MG PO TABS
2.0000 | ORAL_TABLET | ORAL | Status: DC
  Administered 2015-07-21 – 2015-07-22 (×2): 2 via ORAL
  Filled 2015-07-19 (×2): qty 2

## 2015-07-19 MED ORDER — OXYCODONE-ACETAMINOPHEN 5-325 MG PO TABS: 1.0000 | ORAL_TABLET | ORAL | Status: DC | PRN

## 2015-07-19 MED ORDER — OXYCODONE HCL 5 MG PO TABS
10.0000 mg | ORAL_TABLET | ORAL | Status: DC | PRN
Start: 2015-07-19 — End: 2015-07-22
  Administered 2015-07-20 – 2015-07-22 (×11): 10 mg via ORAL
  Filled 2015-07-19 (×11): qty 2

## 2015-07-19 MED ORDER — MORPHINE SULFATE (PF) 0.5 MG/ML IJ SOLN
INTRAMUSCULAR | Status: DC | PRN
  Administered 2015-07-19: 2 mg via EPIDURAL

## 2015-07-19 MED ORDER — ACETAMINOPHEN 325 MG PO TABS
650.0000 mg | ORAL_TABLET | ORAL | Status: DC | PRN
  Administered 2015-07-19 – 2015-07-22 (×7): 650 mg via ORAL
  Filled 2015-07-19 (×7): qty 2

## 2015-07-19 MED ORDER — SODIUM CHLORIDE 0.9 % IJ SOLN
INTRAMUSCULAR | Status: AC
  Filled 2015-07-19: qty 3

## 2015-07-19 MED ORDER — IBUPROFEN 400 MG PO TABS
600.0000 mg | ORAL_TABLET | Freq: Four times a day (QID) | ORAL | Status: DC
  Administered 2015-07-19 – 2015-07-22 (×11): 600 mg via ORAL
  Filled 2015-07-19: qty 2
  Filled 2015-07-19 (×10): qty 1

## 2015-07-19 MED ORDER — MORPHINE SULFATE (PF) 2 MG/ML IV SOLN
1.0000 mg | INTRAVENOUS | Status: DC | PRN
  Administered 2015-07-19 (×2): 2 mg via INTRAVENOUS
  Administered 2015-07-19 (×2): 1 mg via INTRAVENOUS
  Administered 2015-07-20 – 2015-07-21 (×5): 2 mg via INTRAVENOUS
  Filled 2015-07-19 (×8): qty 1

## 2015-07-19 MED ORDER — SIMETHICONE 80 MG PO CHEW
80.0000 mg | CHEWABLE_TABLET | Freq: Three times a day (TID) | ORAL | Status: DC
  Administered 2015-07-20 – 2015-07-22 (×7): 80 mg via ORAL
  Filled 2015-07-19 (×8): qty 1

## 2015-07-19 MED ORDER — PHENYLEPHRINE HCL 10 MG/ML IJ SOLN
INTRAMUSCULAR | Status: DC | PRN
  Administered 2015-07-19 (×5): 100 ug via INTRAVENOUS

## 2015-07-19 MED ORDER — OXYTOCIN 40 UNITS IN LACTATED RINGERS INFUSION - SIMPLE MED: 2.5000 [IU]/h | INTRAVENOUS | Status: AC

## 2015-07-19 MED ORDER — DIPHENHYDRAMINE HCL 25 MG PO CAPS: 25.0000 mg | ORAL_CAPSULE | Freq: Four times a day (QID) | ORAL | Status: DC | PRN

## 2015-07-19 MED ORDER — SIMETHICONE 80 MG PO CHEW
80.0000 mg | CHEWABLE_TABLET | ORAL | Status: DC
  Filled 2015-07-19: qty 1

## 2015-07-19 MED ORDER — WITCH HAZEL-GLYCERIN EX PADS: 1.0000 "application " | MEDICATED_PAD | CUTANEOUS | Status: DC | PRN

## 2015-07-19 MED ORDER — LACTATED RINGERS IV SOLN
INTRAVENOUS | Status: DC
  Administered 2015-07-19 – 2015-07-21 (×4): via INTRAVENOUS

## 2015-07-19 SURGICAL SUPPLY — 27 items
BARRIER ADHS 3X4 INTERCEED (GAUZE/BANDAGES/DRESSINGS) ×3 IMPLANT
BENZOIN TINCTURE PRP APPL 2/3 (GAUZE/BANDAGES/DRESSINGS) ×3 IMPLANT
CANISTER SUCT 3000ML (MISCELLANEOUS) ×3 IMPLANT
CATH KIT ON-Q SILVERSOAK 5IN (CATHETERS) ×3 IMPLANT
CHLORAPREP W/TINT 26ML (MISCELLANEOUS) ×3 IMPLANT
CLOSURE WOUND 1/2 X4 (GAUZE/BANDAGES/DRESSINGS) ×1
DRSG TELFA 3X8 NADH (GAUZE/BANDAGES/DRESSINGS) ×3 IMPLANT
GAUZE SPONGE 4X4 12PLY STRL (GAUZE/BANDAGES/DRESSINGS) ×3 IMPLANT
GOWN STRL REUS W/ TWL LRG LVL3 (GOWN DISPOSABLE) ×3 IMPLANT
GOWN STRL REUS W/TWL LRG LVL3 (GOWN DISPOSABLE) ×6
LIQUID BAND (GAUZE/BANDAGES/DRESSINGS) ×3 IMPLANT
NS IRRIG 1000ML POUR BTL (IV SOLUTION) ×3 IMPLANT
PAD GROUND ADULT SPLIT (MISCELLANEOUS) ×3 IMPLANT
PAD OB MATERNITY 4.3X12.25 (PERSONAL CARE ITEMS) ×3 IMPLANT
PAD PREP 24X41 OB/GYN DISP (PERSONAL CARE ITEMS) ×3 IMPLANT
RTRCTR C-SECT PINK 25CM LRG (MISCELLANEOUS) ×3 IMPLANT
STRIP CLOSURE SKIN 1/2X4 (GAUZE/BANDAGES/DRESSINGS) ×2 IMPLANT
SUT MNCRL 4-0 (SUTURE)
SUT MNCRL 4-0 27XMFL (SUTURE)
SUT PDS AB 1 TP1 96 (SUTURE) ×3 IMPLANT
SUT PLAIN 2 0 XLH (SUTURE) ×3 IMPLANT
SUT PLAIN GUT 2-0 30 C14 SG823 (SUTURE) ×3
SUT VIC AB 0 CT1 36 (SUTURE) ×9 IMPLANT
SUT VIC AB 3-0 SH 27 (SUTURE) ×2
SUT VIC AB 3-0 SH 27X BRD (SUTURE) ×1 IMPLANT
SUTURE MNCRL 4-0 27XMF (SUTURE) IMPLANT
SUTURE PLN GUT2-0 30 C14 SG823 (SUTURE) ×1 IMPLANT

## 2015-07-19 SURGICAL SUPPLY — 21 items
CANISTER SUCT 3000ML (MISCELLANEOUS) IMPLANT
CATH KIT ON-Q SILVERSOAK 5IN (CATHETERS) IMPLANT
CHLORAPREP W/TINT 26ML (MISCELLANEOUS) IMPLANT
DRSG TELFA 3X8 NADH (GAUZE/BANDAGES/DRESSINGS) IMPLANT
GAUZE SPONGE 4X4 12PLY STRL (GAUZE/BANDAGES/DRESSINGS) IMPLANT
GOWN STRL REUS W/ TWL LRG LVL3 (GOWN DISPOSABLE) IMPLANT
GOWN STRL REUS W/TWL LRG LVL3 (GOWN DISPOSABLE)
NS IRRIG 1000ML POUR BTL (IV SOLUTION) IMPLANT
PAD GROUND ADULT SPLIT (MISCELLANEOUS) IMPLANT
PAD OB MATERNITY 4.3X12.25 (PERSONAL CARE ITEMS) IMPLANT
PAD PREP 24X41 OB/GYN DISP (PERSONAL CARE ITEMS) IMPLANT
SUT MNCRL 4-0 (SUTURE)
SUT MNCRL 4-0 27XMFL (SUTURE)
SUT PDS AB 1 TP1 96 (SUTURE) IMPLANT
SUT PLAIN 2 0 XLH (SUTURE) IMPLANT
SUT PLAIN GUT 2-0 30 C14 SG823 (SUTURE)
SUT VIC AB 0 CT1 36 (SUTURE) IMPLANT
SUT VIC AB 3-0 SH 27 (SUTURE)
SUT VIC AB 3-0 SH 27X BRD (SUTURE) IMPLANT
SUTURE MNCRL 4-0 27XMF (SUTURE) IMPLANT
SUTURE PLN GUT2-0 30 C14 SG823 (SUTURE) IMPLANT

## 2015-07-19 NOTE — Progress Notes (Signed)
Called for elevated pt temp of T101.5 this pm. Will add Gentamycin (wt based dose) & Clindamycin 900 mg IV  Tid to cover in case of post partum endometritis. The uterus was not warm or have any sign of odor during the LTCS. Initially, the baby came out through the incision and there was a creamy yellow small amt that could have been pus vs. Amniotic fluid mixed with vernix. Baby also had lt brown meconium. Pt had an UTI and was on antibiotic coverage prior to L&D. Urine ordered, culture and sensitivity. WBC has gone from 11.5 to 14. 1. Peds aware and doing blood cultures on baby but, baby does not have a fever currently. Will cover with Natasha BenceGent and Clinda and since pt has just had surgery , will give IV till IV out. I went to see pt and pressed on her abd and she did not react except around the incision. Will see what blood cultures show.

## 2015-07-19 NOTE — Op Note (Addendum)
Cesarean Section Procedure Note  Date of procedure: 07/19/2015   Pre-operative Diagnosis: Intrauterine pregnancy at [redacted]w[redacted]d; arrest of dilation  Post-operative Diagnosis: same, delivered.  Procedure: Primary Low Transverse Cesarean Section through Pfannenstiel incision  Surgeon: Christeen Douglas, MD  Assistant(s):  Milon Score, CNM  Anesthesia: Epidural anesthesia and General Endotracheal with Epidural Regional  Estimated Blood Loss:  300 mL         Drains: On-Q pump         Total IV Fluids: 1200 ml  Urine Output: 200 ml         Specimens: Cord gas         Complications:  None; patient tolerated the procedure well.         Disposition: PACU - hemodynamically stable.         Condition: stable  Findings:  A female infant "Vincente Liberty" in direct OP presentation. Amniotic fluid - Meconium thick and gooy Birth weight 3190 g.  Apgars of 7 and 9 at one and five minutes respectively.  Intact placenta with a three-vessel cord.  Grossly normal uterus, tubes and ovaries bilaterally. No intraabdominal adhesions were noted.  Indications: failure to progress: arrest of dilation and malpresentation: direct OP  Pt admitted with occasional contractions after PROM on 07/17/15 at 2300 and was found to be 3 cm dilated. Oxytocin was started and reached a max of 30 mu/min without ever having adequate contractions. A pit rest overnight was given and she still only reached 6cm dilated at 36hrs post PROM. The decision was made to proceed with pLTCS for arrest of dilation at 6cm x6hrs and inadequate contractions for a day.  Procedure Details  The patient was taken to Operating Room, identified as the correct patient and the procedure verified as C-Section Delivery. A formal Time Out was held with all team members present and in agreement.  After induction of anesthesia, the patient was draped and prepped in the usual sterile manner. A Pfannenstiel skin incision was made and carried down through the  subcutaneous tissue to the fascia. Fascial incision was made and extended transversely with the Mayo scissors. The fascia was separated from the underlying rectus tissue superiorly and inferiorly. The peritoneum was identified and entered bluntly. Peritoneal incision was extended longitudinally. The utero-vesical peritoneal reflection was incised transversely and a bladder flap was created digitally. The Alexis retractor was placed.  A low transverse hysterotomy was made, with thick meconium protruding. The fetus was delivered atraumatically. The umbilical cord was clamped x2 and cut and the infant was handed to the awaiting pediatricians. The placenta was removed intact and appeared normal, intact, and with a 3-vessel cord.   The uterus was exteriorized and cleared of all clot and debris. The hysterotomy was closed with running sutures of 0-Vicryl. A second imbricating layer was placed with the same suture. Several figure of eights were placed to assure excellent hemostasis. The peritoneal cavity was cleared of all clots and debris. The uterus was returned to the abdomen. The On-Q pump was placed.  The pelvis was irrigated and again, excellent hemostasis was noted. The fascia was then reapproximated with running sutures of 0 Vicryl.  The subcutaneous tissue was reapproximated with running sutures of 0 vicryl. The skin was reapproximated with  staples.   Instrument, sponge, and needle counts were correct prior to the abdominal closure and at the conclusion of the case.   The patient tolerated the procedure well and was transferred to the recovery room in stable condition.   Christeen Douglas,  MD 07/19/2015

## 2015-07-19 NOTE — Transfer of Care (Signed)
Immediate Anesthesia Transfer of Care Note  Patient: Virginia [name]  Procedure(s) Performed: C-Section Patient Location: PACU  Anesthesia Type:Epidural  Level of Consciousness: alert   Airway & Oxygen Therapy: Patient Spontanous Breathing  Post-op Assessment: Report given to RN  Post vital signs: Reviewed  Last Vitals:  Filed Vitals:   07/19/15 1110 07/19/15 1249  BP: 138/72 127/69  Pulse: 102 98  Temp:    Resp: 14 16    Complications: No apparent anesthesia complications

## 2015-07-19 NOTE — Progress Notes (Signed)
Cx exam unchanged after 2 additional hours of Pitocin which has not given an active labor strip: UC's are q 3-4 mins with MVU's 20-25 mm. Pt does feel some pressure but, on exam the cx is starting to swell. A LTC S is being called and pt agreed with plan of care. Dr Henrene HawkingKephart (Anesthesia) called and made aware of need for lTCS. Due to non-emergent, OR just starting a case that will take an 1.5 hr and agreed that baby is reassuring on fetal strip and no medical indication except for "fail to progress". Dr Dalbert GarnetBeasley aware of pt status and agrees 100% with the plan. Partner remaining very silent and non-communicative. Will give Antibiotics.

## 2015-07-19 NOTE — Discharge Instructions (Signed)
Cesarean Delivery Cesarean delivery is the birth of a baby through a cut (incision) in the abdomen and womb (uterus).  LET YOUR HEALTH CARE PROVIDER KNOW ABOUT:  All medicines you are taking, including vitamins, herbs, eye drops, creams, and over-the-counter medicines.  Previous problems you or members of your family have had with the use of anesthetics.  Any bleeding or blood clotting disorders you have.  Family history of blood clots or bleeding disorders.  Any history of deep vein thrombosis (DVT) or pulmonary embolism (PE).  Previous surgeries you have had.  Medical conditions you have.  Any allergies you have.  Complicationsinvolving the pregnancy. RISKS AND COMPLICATIONS  Generally, this is a safe procedure. However, as with any procedure, complications can occur. Possible complications include:  Bleeding.  Infection.  Blood clots.  Injury to surrounding organs.  Problems with anesthesia.  Injury to the baby. BEFORE THE PROCEDURE   You may be given an antacid medicine to drink. This will prevent acid contents in your stomach from going into your lungs if you vomit during the surgery.  You may be given an antibiotic medicine to prevent infection. PROCEDURE   To prevent infection of your incision:  Hair may be removed from your pubic area if it is near your incision.  The skin of your pubic area and lower abdomen will be cleaned with a germ-killing solution (antiseptic).  A tube (Foley catheter) will be placed in your bladder to drain your urine from your bladder into a bag. This keeps your bladder empty during surgery.  An IV tube will be placed in your vein.  You may be given medicine to numb the lower half of your body (regional anesthetic). If you were in labor, you may have already had an epidural in place which can be used in both labor and cesarean delivery. You may possibly be given medicine to make you sleep (general anesthetic) though this is not as  common.  Your heart rate and your baby's heart rate will be monitored.  An incision will be made in your abdomen that extends to your uterus. There are 2 basic kinds of incisions:  The horizontal (transverse) incision. Horizontal incisions are from side to side and are used for most routine cesarean deliveries.  The vertical incision. The vertical incision is from the top of the abdomen to the bottom and is less commonly used. It is often done for women who have a serious complication (extreme prematurity) or under emergency situations.  The horizontal and vertical incisions may both be used at the same time. However, this is very uncommon.  An incision is then made in your uterus to deliver the baby.  Your baby will be delivered.  Your health care provider may place the baby on your chest. It is important to keep the baby warm. Your health care provider will dry off the baby, place the baby directly on your bare skin, and cover the baby with warm, dry blankets.  Both incisions will be closed with absorbable stitches. AFTER THE PROCEDURE   If you were awake during the surgery, you will see your baby right away. If you were asleep, you will see your baby as soon as you are awake.  You may breastfeed your baby after surgery.  You may be able to get up and walk the same day as the surgery. If you need to stay in bed for a period of time, you will receive help to turn, cough, and take deep breaths after   surgery. This helps prevent lung problems such as pneumonia.  Do not get out of bed alone the first time after surgery. You will need help getting out of bed until you are able to do this by yourself.  You may be able to shower the day after your cesarean delivery. After the bandage (dressing) is taken off the incision site, a nurse will assist you to shower if you would like help.  You may be directed to take actions to help prevent blood clots in your legs. These may  include:  Walking shortly after surgery, with someone assisting you. Moving around after surgery helps to improve blood flow.  Wearing compression stockings or using different types of devices.  Taking medicines to thin your blood (anticoagulants) if you are at high risk for DVT or PE.  Save any blood clots that you pass from your vagina. If you pass a clot while on the toilet, do not flush it. Call for the nurse. Tell the nurse if you think you are bleeding too much or passing too many clots.  You will be given medicine for pain and nausea as needed. Let your health care providers know if you are hurting. You may also be given an antibiotic to prevent an infection.  Your IV tube will be taken out when you are drinking a reasonable amount of fluids. The Foley catheter is taken out when you are up and walking.  If your blood type is Rh negative and your baby's blood type is Rh positive, you will be given a shot of anti-D immune globulin. This shot prevents you from having Rh problems with a future pregnancy. You should get the shot even if you had your tubes tied (tubal ligation).  If you are allowed to take the baby for a walk, place the baby in the bassinet and push it.   This information is not intended to replace advice given to you by your health care provider. Make sure you discuss any questions you have with your health care provider.   Document Released: 06/27/2005 Document Revised: 03/18/2015 Document Reviewed: 02/22/2012 Elsevier Interactive Patient Education 2016 [employer]  

## 2015-07-19 NOTE — Progress Notes (Addendum)
Milon Scorearon Jones, CNM at bedside discussing plan of care with patient.  Pitocin to be turned off and restarted at 0600.   Patient is in agreement with plan.
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
## 2015-07-20 ENCOUNTER — Encounter: Payer: Self-pay | Admitting: Certified Registered Nurse Anesthetist

## 2015-07-20 ENCOUNTER — Encounter: Payer: Self-pay | Admitting: Obstetrics and Gynecology

## 2015-07-20 LAB — CBC
HCT: 32 % — ABNORMAL LOW (ref 35.0–47.0)
Hemoglobin: 10.7 g/dL — ABNORMAL LOW (ref 12.0–16.0)
MCH: 29.7 pg (ref 26.0–34.0)
MCHC: 33.3 g/dL (ref 32.0–36.0)
MCV: 89.2 fL (ref 80.0–100.0)
PLATELETS: 145 10*3/uL — AB (ref 150–440)
RBC: 3.59 MIL/uL — ABNORMAL LOW (ref 3.80–5.20)
RDW: 13.7 % (ref 11.5–14.5)
WBC: 12.9 10*3/uL — ABNORMAL HIGH (ref 3.6–11.0)
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
## 2015-07-20 NOTE — Progress Notes (Signed)
Subjective: Postpartum Day 1: Cesarean Delivery Patient reports incisional pain.  No n/v, no pain with breast feeding  Objective: Vital signs in last 24 hours: Temp:  [98.1 F (36.7 C)-102.6 F (39.2 C)] 98.2 F (36.8 C) (01/09 0730) Pulse Rate:  [81-120] 86 (01/09 0730) Resp:  [14-18] 18 (01/09 0730) BP: (103-165)/(51-117) 112/66 mmHg (01/09 0730) SpO2:  [97 %-100 %] 97 % (01/09 0730)  Tmax: 102.6 last night at 2100, now afebrile.  Physical Exam:  General: alert, cooperative and moderate distress Lochia: appropriate Uterine Fundus: firm, but limited by guarding with skin sensitivity. Her fundus does not seem to be extraordinarily tender, but her rightsided skin did. No evidence of cellulitis. Dressing still intact until 24hrs postop. Incision: no significant drainage, no significant erythema DVT Evaluation: No evidence of DVT seen on physical exam. No cords or calf tenderness.   Recent Labs  07/19/15 2109 07/20/15 0624  HGB 10.7* 10.7*  HCT 32.4* 32.0*    Assessment/Plan: Status post Cesarean section. Postoperative course complicated by endometritis, presumed with prolonged rupture of membranes, IUPC and meconium. Fever spiked 6hrs after delivery, with rise in WBC. On amp/gent until clinical improvement, with possible po course to go home with and at least 24hrs afebrile. Will trend WBC x1 more time.  Continue current care.  Kendrew Paci 07/20/2015, 10:00 AM

## 2015-07-20 NOTE — Anesthesia Postprocedure Evaluation (Signed)
Anesthesia Post Note  Patient: Virginia MorrowRuvonia L Hipps  Procedure(s) Performed: * No procedures listed *  Patient location during evaluation: Mother Baby Anesthesia Type: Epidural Level of consciousness: awake and alert and oriented Pain management: satisfactory to patient Vital Signs Assessment: post-procedure vital signs reviewed and stable Respiratory status: respiratory function stable Cardiovascular status: stable Postop Assessment: no headache and no backache Anesthetic complications: no    Last Vitals:  Filed Vitals:   07/20/15 0457 07/20/15 0730  BP: 117/57 112/66  Pulse: 91 86  Temp: 37 C 36.8 C  Resp: 18 18    Last Pain:  Filed Vitals:   07/20/15 0730  PainSc: 7                  Clydene PughBeane, Sheritta Deeg D

## 2015-07-20 NOTE — Addendum Note (Signed)
Addendum  created 07/20/15 0735 by Malva Coganatherine Shahil Speegle, CRNA   Modules edited: Clinical Notes, Notes Section   Clinical Notes:  File: [id number]   Notes Section:  File: [id number]

## 2015-07-20 NOTE — Anesthesia Post-op Follow-up Note (Signed)
  Anesthesia Pain Follow-up Note  Patient: Virginia MorrowRuvonia L Kabel  Day #: 1  Date of Follow-up: 07/20/2015 Time: 7:34 AM  Last Vitals:  Filed Vitals:   07/20/15 0457 07/20/15 0730  BP: 117/57 112/66  Pulse: 91 86  Temp: 37 C 36.8 C  Resp: 18 18    Level of Consciousness: alert  Pain: mild   Side Effects:None  Catheter Site Exam: site not evaluated  Plan: D/C from anesthesia care  Clydene PughBeane, Charlton Boule D
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
## 2015-07-21 LAB — CBC
HEMATOCRIT: 31 % — AB (ref 35.0–47.0)
HEMOGLOBIN: 10.5 g/dL — AB (ref 12.0–16.0)
MCH: 29.8 pg (ref 26.0–34.0)
MCHC: 33.8 g/dL (ref 32.0–36.0)
MCV: 88.3 fL (ref 80.0–100.0)
Platelets: 178 10*3/uL (ref 150–440)
RBC: 3.51 MIL/uL — AB (ref 3.80–5.20)
RDW: 13.9 % (ref 11.5–14.5)
WBC: 13.3 10*3/uL — AB (ref 3.6–11.0)

## 2015-07-21 LAB — URINE CULTURE: Culture: NO GROWTH

## 2015-07-21 LAB — GENTAMICIN LEVEL, TROUGH: Gentamicin Trough: 0.6 ug/mL (ref 0.5–2.0)

## 2015-07-21 LAB — GENTAMICIN LEVEL, PEAK: Gentamicin Pk: 5.6 ug/mL (ref 5.0–10.0)

## 2015-07-21 LAB — SURGICAL PATHOLOGY

## 2015-07-21 MED ORDER — GENTAMICIN SULFATE 40 MG/ML IJ SOLN
Freq: Three times a day (TID) | INTRAVENOUS | Status: DC
  Administered 2015-07-22: 01:00:00 via INTRAVENOUS
  Filled 2015-07-21 (×5): qty 4
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
## 2015-07-21 NOTE — Progress Notes (Signed)
Lab tech in room to draw blood for CBC; pt requested that lab tech return later; this will be passed along to the oncoming lab tech and the oncoming RN

## 2015-07-21 NOTE — Lactation Note (Signed)
This note was copied from the chart of Virginia Gladis RiffleRuvonia Yaworski. Lactation Consultation Note  Patient Name: Virginia [name] ZOXWR'UToday's Date: 07/21/2015 Reason for consult: Initial assessment   Maternal Data Has patient been taught Hand Expression?: Yes  Mom c/o fussy baby since last night, so she gave formula in bottle out of fear the baby was not getting enough. Baby has had adequate diapers, VSS, weight WNL, no other issues. Mom is P1 and states does not have a lot of breastfeeding education. She had been very tired after C/S. She questioned her ability to have enough milk for baby. I reviewed known risk factors that may negatively affect supply, and she had none of them. I reviewed quality of feedings and believe the main cause of fussy baby is Mom and baby being too sleepy to get enough of the available colostrum from breast. I showed her the breastfeeding book explaining normal volumes of milk for babies (she and been giving 15-20 ml formula) and how that impacts baby and breastfeeding in a negative way, especially if not needed for medical reason, etc. She had also given baby pacifier.  I explained how that impacts feedings and weight loss too. She agreed to let me help her with breastfeeding, which went quite well. I only needed to help her make subtle adjustments to get deeper latch; skin to skin; and keep light and loud sound in room to a minimum right now. Baby nursed quite well for over 10 minutes on right side adn parents were able to position and latch her to left side wihtout as much help from me. Mom hears her swallows and does not hurt. I did urge them to "try" to avoid formula, but agreed that she has had so much that it may take a while for her to be satisfied with the lower/normal volume of colostrum. If parents feel the need to give formula for "sanity", or a medical reason arises, try smaller volumes and may with syringe at breast to help the transition.   Feeding Feeding Type: Breast  Fed Length of feed: 15 min  LATCH Score/Interventions Latch: Grasps breast easily, tongue down, lips flanged, rhythmical sucking.  Audible Swallowing: A few with stimulation Intervention(s): Hand expression  Type of Nipple: Everted at rest and after stimulation  Comfort (Breast/Nipple): Soft / non-tender     Hold (Positioning): No assistance needed to correctly position infant at breast.  LATCH Score: 9  Lactation Tools Discussed/Used     Consult Status Consult Status: Follow-up Date: 07/22/15 Follow-up type: In-patient    Sunday CornSandra Clark [name] 07/21/2015, 4:29 PM

## 2015-07-21 NOTE — Progress Notes (Signed)
Subjective: Postpartum Day 2: Cesarean Delivery, complicated by PPE . Currently on Gent + Cleocin . Fever last pm 100.4. Overall temp ceuve coming down .  Patient reports feels somewhat better today .    Objective: Vital signs in last 24 hours: Temp:  [97.8 F (36.6 C)-100.4 F (38 C)] 97.8 F (36.6 C) (01/10 0824) Pulse Rate:  [78-103] 87 (01/10 0824) Resp:  [17-20] 17 (01/10 0824) BP: (125-153)/(59-87) 129/83 mmHg (01/10 0824) SpO2:  [97 %-99 %] 99 % (01/10 0824)  Physical Exam:  General: alert and cooperative  Lungs CTA .  CV RRR  Abd soft NT.  Lochia: appropriate Uterine Fundus: firm Incision: healing well DVT Evaluation: No evidence of DVT seen on physical exam.   Recent Labs  07/20/15 0624 07/21/15 0743  HGB 10.7* 10.5*  HCT 32.0* 31.0*  WBC 13.3   Assessment/Plan: Status post Cesarean section. Postoperative course complicated by PPE with clinical improvement   Continue current care Saline lock IV  Pt may shower  If afebrile until tomorrow d/c in am .  Pavel Gadd 07/21/2015, 8:47 AM

## 2015-07-22 MED ORDER — NORETHINDRONE 0.35 MG PO TABS: 1.0000 | ORAL_TABLET | Freq: Every day | ORAL | Status: DC

## 2015-07-22 MED ORDER — DOCUSATE SODIUM 100 MG PO CAPS: 100.0000 mg | ORAL_CAPSULE | Freq: Two times a day (BID) | ORAL | Status: DC

## 2015-07-22 MED ORDER — IBUPROFEN 800 MG PO TABS: 800.0000 mg | ORAL_TABLET | Freq: Three times a day (TID) | ORAL | Status: DC | PRN

## 2015-07-22 MED ORDER — OXYCODONE HCL 5 MG PO TABS: 5.0000 mg | ORAL_TABLET | ORAL | Status: DC | PRN

## 2015-07-22 NOTE — Progress Notes (Signed)
Patient understands all discharge instructions and the need to make follow up appointments.Provided instructions verbally and handout on the proper care of incision and the removal of On-q pump Patient discharge via wheelchair with CNA.

## 2015-07-22 NOTE — Discharge Summary (Signed)
Obstetric Discharge Summary Reason for Admission: onset of labor Prenatal Procedures: none Intrapartum Procedures: cesarean: low cervical, transverse Postpartum Procedures: antibiotics Complications-Operative and Postpartum: PPE HEMOGLOBIN  Date Value Ref Range Status  07/21/2015 10.5* 12.0 - 16.0 g/dL Final   HCT  Date Value Ref Range Status  07/21/2015 31.0* 35.0 - 47.0 % Final    Physical Exam:  General: alert and cooperative  Lungs CTA CV RRR Lochia: appropriate Uterine Fundus: firm Incision: healing well DVT Evaluation: No evidence of DVT seen on physical exam.  Discharge Diagnoses: Term Pregnancy-delivered  Discharge Information: Date: 07/22/2015 Activity: pelvic rest Diet: routine Medications: Ibuprofen, Colace and roxicodone , micronor  Condition: stable Instructions: refer to practice specific booklet Discharge to: home Follow-up Information    Follow up In 2 weeks.      Follow up with Christeen DouglasBEASLEY, BETHANY, MD In 2 weeks.   Specialty:  Obstetrics and Gynecology   Why:  For wound re-check   Contact information:   [address] [place] [id number] [phone]       Newborn Data: Live born female  Birth Weight: 7 lb 0.5 oz (3190 g) APGAR: 7, 9  Home with mother.  Virginia [name] 07/22/2015, 9:01 AM

## 2015-07-22 NOTE — Progress Notes (Signed)
Patient ID: Virginia [name], female   DOB: 05/02/1986, 30 y.o.   MRN: [id number] POD#3 from LTCS complicated byChorio+ PPE . GENt + cleocin . Gent Peak + trough good . Afebrile now 36 hr .  Incision C/D/I . Will d/c home
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
## 2015-07-24 LAB — CULTURE, BLOOD (ROUTINE X 2)
CULTURE: NO GROWTH
CULTURE: NO GROWTH

## 2016-01-11 ENCOUNTER — Emergency Department
Admission: EM | Admit: 2016-01-11 | Discharge: 2016-01-11 | Disposition: A | Discharge status: Home / Self Care | Payer: Medicaid Other | Attending: Emergency Medicine | Admitting: Emergency Medicine

## 2016-01-11 ENCOUNTER — Encounter: Payer: Self-pay | Admitting: Emergency Medicine

## 2016-01-11 DIAGNOSIS — A084 Viral intestinal infection, unspecified: Secondary | ICD-10-CM | POA: Insufficient documentation

## 2016-01-11 DIAGNOSIS — Z87891 Personal history of nicotine dependence: Secondary | ICD-10-CM | POA: Insufficient documentation

## 2016-01-11 DIAGNOSIS — R112 Nausea with vomiting, unspecified: Secondary | ICD-10-CM | POA: Diagnosis present

## 2016-01-11 LAB — COMPREHENSIVE METABOLIC PANEL
ALK PHOS: 44 U/L (ref 38–126)
ALT: 8 U/L — AB (ref 14–54)
AST: 16 U/L (ref 15–41)
Albumin: 4.3 g/dL (ref 3.5–5.0)
Anion gap: 8 (ref 5–15)
BILIRUBIN TOTAL: 0.8 mg/dL (ref 0.3–1.2)
BUN: 11 mg/dL (ref 6–20)
CALCIUM: 8.8 mg/dL — AB (ref 8.9–10.3)
CO2: 25 mmol/L (ref 22–32)
CREATININE: 0.99 mg/dL (ref 0.44–1.00)
Chloride: 105 mmol/L (ref 101–111)
Glucose, Bld: 109 mg/dL — ABNORMAL HIGH (ref 65–99)
Potassium: 3.3 mmol/L — ABNORMAL LOW (ref 3.5–5.1)
Sodium: 138 mmol/L (ref 135–145)
Total Protein: 7.7 g/dL (ref 6.5–8.1)

## 2016-01-11 LAB — URINALYSIS COMPLETE WITH MICROSCOPIC (ARMC ONLY)
BILIRUBIN URINE: NEGATIVE
GLUCOSE, UA: NEGATIVE mg/dL
KETONES UR: NEGATIVE mg/dL
Nitrite: NEGATIVE
PH: 5 (ref 5.0–8.0)
Protein, ur: 30 mg/dL — AB
Specific Gravity, Urine: 1.025 (ref 1.005–1.030)

## 2016-01-11 LAB — CBC
HCT: 41.3 % (ref 35.0–47.0)
Hemoglobin: 13.9 g/dL (ref 12.0–16.0)
MCH: 29.5 pg (ref 26.0–34.0)
MCHC: 33.7 g/dL (ref 32.0–36.0)
MCV: 87.6 fL (ref 80.0–100.0)
PLATELETS: 225 10*3/uL (ref 150–440)
RBC: 4.72 MIL/uL (ref 3.80–5.20)
RDW: 13.7 % (ref 11.5–14.5)
WBC: 6.1 10*3/uL (ref 3.6–11.0)

## 2016-01-11 LAB — POCT PREGNANCY, URINE: Preg Test, Ur: NEGATIVE

## 2016-01-11 LAB — LIPASE, BLOOD: LIPASE: 22 U/L (ref 11–51)

## 2016-01-11 MED ORDER — CIPROFLOXACIN HCL 500 MG PO TABS
ORAL_TABLET | ORAL | Status: DC
Start: 2016-01-11 — End: 2016-01-11
  Filled 2016-01-11: qty 1

## 2016-01-11 MED ORDER — ACETAMINOPHEN 325 MG PO TABS
650.0000 mg | ORAL_TABLET | Freq: Once | ORAL | Status: AC
  Administered 2016-01-11: 650 mg via ORAL
  Filled 2016-01-11: qty 2

## 2016-01-11 MED ORDER — ONDANSETRON HCL 4 MG PO TABS: 4.0000 mg | ORAL_TABLET | Freq: Every day | ORAL | Status: DC | PRN

## 2016-01-11 MED ORDER — SODIUM CHLORIDE 0.9 % IV SOLN
1000.0000 mL | Freq: Once | INTRAVENOUS | Status: AC
  Administered 2016-01-11: 1000 mL via INTRAVENOUS

## 2016-01-11 MED ORDER — ONDANSETRON HCL 4 MG/2ML IJ SOLN
4.0000 mg | Freq: Once | INTRAMUSCULAR | Status: AC
  Administered 2016-01-11: 4 mg via INTRAVENOUS
  Filled 2016-01-11: qty 2

## 2016-01-11 MED ORDER — ONDANSETRON 4 MG PO TBDP: 4.0000 mg | ORAL_TABLET | Freq: Once | ORAL | Status: DC | PRN

## 2016-01-11 NOTE — ED Provider Notes (Signed)
[facility] Emergency Department Provider Note  ____________________________________________    I have reviewed the triage vital signs and the nursing notes.   HISTORY  Chief Complaint Emesis    HPI Virginia [name] is a 30 y.o. female who presents with nausea and vomiting since approximately 3 AM. She reports she is not been tolerating by mouth's all day. She denies abdominal pain. No diarrhea. She feels weak and dehydrated. No sick contacts. No recent travel. Nonbilious nonbloody vomiting.     Past Medical History  Diagnosis Date  . Medical history non-contributory     Patient Active Problem List   Diagnosis Date Noted  . S/P cesarean section 07/19/2015  . Labor and delivery indication for care or intervention 07/18/2015  . Pregnancy 07/17/2015  . Labor and delivery, indication for care 07/07/2015  . Back pain 06/18/2015    Past Surgical History  Procedure Laterality Date  . No past surgeries    . Cesarean section N/A 07/19/2015    Procedure: CESAREAN SECTION;  Surgeon: Christeen DouglasBethany Beasley, MD;  Location: [facility] ORS;  Service: Obstetrics;  Laterality: N/A;    Current Outpatient Rx  Name  Route  Sig  Dispense  Refill  . docusate sodium (COLACE) 100 MG capsule   Oral   Take 1 capsule (100 mg total) by mouth 2 (two) times daily.   60 capsule   0   . ibuprofen (ADVIL,MOTRIN) 800 MG tablet   Oral   Take 1 tablet (800 mg total) by mouth every 8 (eight) hours as needed.   60 tablet   0   . norethindrone (ORTHO MICRONOR) 0.35 MG tablet   Oral   Take 1 tablet (0.35 mg total) by mouth daily.   1 Package   11   . oxyCODONE (OXY IR/ROXICODONE) 5 MG immediate release tablet   Oral   Take 1 tablet (5 mg total) by mouth every 4 (four) hours as needed (pain scale 4-7).   30 tablet   0   . oxyCODONE-acetaminophen (PERCOCET) 5-325 MG tablet   Oral   Take 1 tablet by mouth every 4 (four) hours as needed for moderate pain or severe pain.   30  tablet   0   . Prenatal Vit-Fe Fumarate-FA (MULTIVITAMIN-PRENATAL) 27-0.8 MG TABS tablet   Oral   Take 1 tablet by mouth daily at 12 noon.           Allergies Review of patient's allergies indicates no known allergies.  No family history on file.  Social History Social History  Substance Use Topics  . Smoking status: Former Games developermoker  . Smokeless tobacco: None  . Alcohol Use: No    Review of Systems  Constitutional: Negative for fever.  ENT: Negative for sore throat Cardiovascular: Negative for chest pain Respiratory: Negative for shortness of breath. Gastrointestinal: Negative for abdominal pain, Positive for nausea and vomiting Genitourinary: Negative for dysuria. Musculoskeletal: Negative for back pain. Skin: Negative for rash. Neurological: Negative for focal weakness Psychiatric: no anxiety    ____________________________________________   PHYSICAL EXAM:  VITAL SIGNS: ED Triage Vitals  Enc Vitals Group     BP 01/11/16 1904 135/85 mmHg     Pulse Rate 01/11/16 1904 121     Resp --      Temp 01/11/16 1904 101.1 F (38.4 C)     Temp Source 01/11/16 1904 Oral     SpO2 01/11/16 1904 99 %     Weight 01/11/16 1904 192 lb (87.091 kg)  Height 01/11/16 1904 5\' 1"  (1.549 m)     Head Cir --      Peak Flow --      Pain Score 01/11/16 1857 10     Pain Loc --      Pain Edu? --      Excl. in GC? --      Constitutional: Alert and oriented. No acute distress Eyes: Conjunctivae are normal. No erythema or injection ENT   Head: Normocephalic and atraumatic.   Mouth/Throat: Mucous membranes are moist. Pharynx is normal Cardiovascular: Cardiac, regular rhythm. Normal and symmetric distal pulses are present in the upper extremities. Respiratory: Normal respiratory effort without tachypnea nor retractions. Breath sounds are clear and equal bilaterally.  Gastrointestinal: Soft and non-tender in all quadrants. No distention. There is no CVA  tenderness. Genitourinary: deferred Musculoskeletal: Nontender with normal range of motion in all extremities. No lower extremity tenderness nor edema. Neurologic:  Normal speech and language. No gross focal neurologic deficits are appreciated. Skin:  Skin is warm, dry and intact. No rash noted. Psychiatric: Mood and affect are normal. Patient exhibits appropriate insight and judgment.  ____________________________________________    LABS (pertinent positives/negatives)  Labs Reviewed  COMPREHENSIVE METABOLIC PANEL - Abnormal; Notable for the following:    Potassium 3.3 (*)    Glucose, Bld 109 (*)    Calcium 8.8 (*)    ALT 8 (*)    All other components within normal limits  URINALYSIS COMPLETEWITH MICROSCOPIC ([facility] ONLY) - Abnormal; Notable for the following:    Color, Urine AMBER (*)    APPearance CLOUDY (*)    Hgb urine dipstick 2+ (*)    Protein, ur 30 (*)    Leukocytes, UA TRACE (*)    Bacteria, UA RARE (*)    Squamous Epithelial / LPF 6-30 (*)    All other components within normal limits  LIPASE, BLOOD  CBC  POC URINE PREG, ED  POCT PREGNANCY, URINE    ____________________________________________   EKG  None  ____________________________________________    RADIOLOGY  None  ____________________________________________   PROCEDURES  Procedure(s) performed: none  Critical Care performed: none  ____________________________________________   INITIAL IMPRESSION / ASSESSMENT AND PLAN / ED COURSE  Pertinent labs & imaging results that were available during my care of the patient were reviewed by me and considered in my medical decision making (see chart for details).  Patient with fever tachycardia and nausea and vomiting without abdominal pain. Suspect viral gastritis, we will give IV fluids, IV Zofran, check labs and reevaluate.  ----------------------------------------- 9:35 PM on 01/11/2016 -----------------------------------------  Patient is  reporting that her nausea is better although now she has begun to develop diarrhea. This is consistent with a viral gastroenteritis. Patient is stable for discharge, we will Rx Zofran. Return precautions discussed  ____________________________________________   FINAL CLINICAL IMPRESSION(S) / ED DIAGNOSES  Final diagnoses:  Viral gastroenteritis          Jene Everyobert Sydell Prowell, MD 01/11/16 2227

## 2016-01-11 NOTE — Discharge Instructions (Signed)

## 2016-01-11 NOTE — ED Notes (Signed)
Patient presents to the ED with nausea and vomiting since 3am.  Patient reports vomiting more than 10 times.  Patient denies diarrhea and denies abdominal pain.  Patient ambulatory to triage.

## 2016-01-11 NOTE — ED Notes (Signed)
Patient resting comfortably. Has not vomited since arriving at hospital.

## 2016-10-19 ENCOUNTER — Encounter: Payer: Self-pay | Admitting: Radiology

## 2016-10-19 ENCOUNTER — Emergency Department
Admission: EM | Admit: 2016-10-19 | Discharge: 2016-10-19 | Disposition: A | Discharge status: Home / Self Care | Payer: 59 | Attending: Emergency Medicine | Admitting: Emergency Medicine

## 2016-10-19 ENCOUNTER — Emergency Department: Payer: 59

## 2016-10-19 DIAGNOSIS — K8020 Calculus of gallbladder without cholecystitis without obstruction: Secondary | ICD-10-CM

## 2016-10-19 DIAGNOSIS — R1012 Left upper quadrant pain: Secondary | ICD-10-CM | POA: Diagnosis present

## 2016-10-19 DIAGNOSIS — Z87891 Personal history of nicotine dependence: Secondary | ICD-10-CM | POA: Insufficient documentation

## 2016-10-19 DIAGNOSIS — R112 Nausea with vomiting, unspecified: Secondary | ICD-10-CM

## 2016-10-19 DIAGNOSIS — R197 Diarrhea, unspecified: Secondary | ICD-10-CM

## 2016-10-19 LAB — URINALYSIS, COMPLETE (UACMP) WITH MICROSCOPIC
Bacteria, UA: NONE SEEN
Bilirubin Urine: NEGATIVE
GLUCOSE, UA: NEGATIVE mg/dL
HGB URINE DIPSTICK: NEGATIVE
Ketones, ur: NEGATIVE mg/dL
Leukocytes, UA: NEGATIVE
NITRITE: NEGATIVE
PH: 5 (ref 5.0–8.0)
Protein, ur: NEGATIVE mg/dL
Specific Gravity, Urine: 1.023 (ref 1.005–1.030)

## 2016-10-19 LAB — BASIC METABOLIC PANEL
Anion gap: 5 (ref 5–15)
BUN: 17 mg/dL (ref 6–20)
CHLORIDE: 105 mmol/L (ref 101–111)
CO2: 28 mmol/L (ref 22–32)
CREATININE: 0.76 mg/dL (ref 0.44–1.00)
Calcium: 8.8 mg/dL — ABNORMAL LOW (ref 8.9–10.3)
Glucose, Bld: 127 mg/dL — ABNORMAL HIGH (ref 65–99)
POTASSIUM: 3.5 mmol/L (ref 3.5–5.1)
Sodium: 138 mmol/L (ref 135–145)

## 2016-10-19 LAB — CBC
HCT: 39.7 % (ref 35.0–47.0)
HEMOGLOBIN: 13 g/dL (ref 12.0–16.0)
MCH: 29.3 pg (ref 26.0–34.0)
MCHC: 32.8 g/dL (ref 32.0–36.0)
MCV: 89.3 fL (ref 80.0–100.0)
Platelets: 244 10*3/uL (ref 150–440)
RBC: 4.45 MIL/uL (ref 3.80–5.20)
RDW: 13.3 % (ref 11.5–14.5)
WBC: 11.1 10*3/uL — ABNORMAL HIGH (ref 3.6–11.0)

## 2016-10-19 LAB — POCT PREGNANCY, URINE: Preg Test, Ur: NEGATIVE

## 2016-10-19 MED ORDER — IOPAMIDOL (ISOVUE-300) INJECTION 61%: 30.0000 mL | Freq: Once | INTRAVENOUS | Status: DC | PRN

## 2016-10-19 MED ORDER — LOPERAMIDE HCL 2 MG PO CAPS
4.0000 mg | ORAL_CAPSULE | Freq: Once | ORAL | Status: AC
  Administered 2016-10-19: 4 mg via ORAL

## 2016-10-19 MED ORDER — ONDANSETRON HCL 4 MG/2ML IJ SOLN
4.0000 mg | Freq: Once | INTRAMUSCULAR | Status: AC
  Administered 2016-10-19: 4 mg via INTRAVENOUS

## 2016-10-19 MED ORDER — ONDANSETRON HCL 4 MG/2ML IJ SOLN
INTRAMUSCULAR | Status: AC
  Administered 2016-10-19: 4 mg via INTRAVENOUS
  Filled 2016-10-19: qty 2

## 2016-10-19 MED ORDER — ONDANSETRON 4 MG PO TBDP: 4.0000 mg | ORAL_TABLET | Freq: Three times a day (TID) | ORAL | 0 refills | Status: DC | PRN

## 2016-10-19 MED ORDER — MORPHINE SULFATE (PF) 4 MG/ML IV SOLN
INTRAVENOUS | Status: AC
  Administered 2016-10-19: 4 mg via INTRAVENOUS
  Filled 2016-10-19: qty 1

## 2016-10-19 MED ORDER — IOPAMIDOL (ISOVUE-300) INJECTION 61%
100.0000 mL | Freq: Once | INTRAVENOUS | Status: AC | PRN
  Administered 2016-10-19: 100 mL via INTRAVENOUS

## 2016-10-19 MED ORDER — SODIUM CHLORIDE 0.9 % IV BOLUS (SEPSIS)
1000.0000 mL | Freq: Once | INTRAVENOUS | Status: AC
Start: 2016-10-19 — End: 2016-10-19
  Administered 2016-10-19: 1000 mL via INTRAVENOUS

## 2016-10-19 MED ORDER — MORPHINE SULFATE (PF) 4 MG/ML IV SOLN
4.0000 mg | Freq: Once | INTRAVENOUS | Status: AC
Start: 2016-10-19 — End: 2016-10-19
  Administered 2016-10-19: 4 mg via INTRAVENOUS

## 2016-10-19 MED ORDER — LOPERAMIDE HCL 2 MG PO CAPS
ORAL_CAPSULE | ORAL | Status: AC
  Administered 2016-10-19: 4 mg via ORAL
  Filled 2016-10-19: qty 2

## 2016-10-19 MED ORDER — MORPHINE SULFATE (PF) 4 MG/ML IV SOLN
4.0000 mg | Freq: Once | INTRAVENOUS | Status: AC
  Administered 2016-10-19: 4 mg via INTRAVENOUS

## 2016-10-19 NOTE — ED Provider Notes (Addendum)
[facility] Emergency Department Provider Note   First MD Initiated Contact with Patient 10/19/16 0430     (approximate)  I have reviewed the triage vital signs and the nursing notes.   HISTORY  Chief Complaint Emesis    HPI [place] [name] is a 31 y.o. female presents to the emergency department with 10 out of 10 mid abdominal pain with onset at 2:00 a.m. Patient also admits to nausea vomiting and diarrhea. Patient denies any fever no urinary symptoms. Patient denies any aggravating or alleviating factors.   Past Medical History:  Diagnosis Date  . Medical history non-contributory     Patient Active Problem List   Diagnosis Date Noted  . S/P cesarean section 07/19/2015  . Labor and delivery indication for care or intervention 07/18/2015  . Pregnancy 07/17/2015  . Labor and delivery, indication for care 07/07/2015  . Back pain 06/18/2015    Past Surgical History:  Procedure Laterality Date  . CESAREAN SECTION N/A 07/19/2015   Procedure: CESAREAN SECTION;  Surgeon: Christeen Douglas, MD;  Location: [facility] ORS;  Service: Obstetrics;  Laterality: N/A;  . NO PAST SURGERIES      Prior to Admission medications   Medication Sig Start Date End Date Taking? Authorizing Provider  docusate sodium (COLACE) 100 MG capsule Take 1 capsule (100 mg total) by mouth 2 (two) times daily. 07/22/15   Ihor Austin Schermerhorn, MD  ibuprofen (ADVIL,MOTRIN) 800 MG tablet Take 1 tablet (800 mg total) by mouth every 8 (eight) hours as needed. 07/22/15   Ihor Austin Schermerhorn, MD  norethindrone (ORTHO MICRONOR) 0.35 MG tablet Take 1 tablet (0.35 mg total) by mouth daily. 07/22/15   Ihor Austin Schermerhorn, MD  ondansetron (ZOFRAN) 4 MG tablet Take 1 tablet (4 mg total) by mouth daily as needed for nausea or vomiting. 01/11/16   Jene Every, MD  oxyCODONE (OXY IR/ROXICODONE) 5 MG immediate release tablet Take 1 tablet (5 mg total) by mouth every 4 (four) hours as needed (pain scale 4-7).  07/22/15   Ihor Austin Schermerhorn, MD  oxyCODONE-acetaminophen (PERCOCET) 5-325 MG tablet Take 1 tablet by mouth every 4 (four) hours as needed for moderate pain or severe pain. 07/19/15   Sharee Pimple, CNM  Prenatal Vit-Fe Fumarate-FA (MULTIVITAMIN-PRENATAL) 27-0.8 MG TABS tablet Take 1 tablet by mouth daily at 12 noon.    Historical Provider, MD    Allergies No known drug allergies  Family history Noncontributory  Social History Social History  Substance Use Topics  . Smoking status: Former Games developer  . Smokeless tobacco: Not on file  . Alcohol use No    Review of Systems Constitutional: No fever/chills Eyes: No visual changes. ENT: No sore throat. Cardiovascular: Denies chest pain. Respiratory: Denies shortness of breath. Gastrointestinal: Positive for abdominal pain and vomiting and diarrhea Genitourinary: Negative for dysuria. Musculoskeletal: Negative for back pain. Skin: Negative for rash. Neurological: Negative for headaches, focal weakness or numbness.  10-point ROS otherwise negative.  ____________________________________________   PHYSICAL EXAM:  VITAL SIGNS: ED Triage Vitals  Enc Vitals Group     BP 10/19/16 0414 123/85     Pulse Rate 10/19/16 0414 73     Resp 10/19/16 0414 18     Temp 10/19/16 0414 98.3 F (36.8 C)     Temp Source 10/19/16 0414 Oral     SpO2 10/19/16 0414 100 %     Weight 10/19/16 0412 190 lb (86.2 kg)     Height 10/19/16 0412  (1.549 m)  Head Circumference --      Peak Flow --      Pain Score 10/19/16 0412 10     Pain Loc --      Pain Edu? --      Excl. in GC? --     Constitutional: Alert and oriented. Well appearing and in no acute distress. Eyes: Conjunctivae are normal. PERRL. EOMI. Head: Atraumatic. Ears:  Healthy appearing ear canals and TMs bilaterally Nose: No congestion/rhinnorhea. Mouth/Throat: Mucous membranes are moist.  Oropharynx non-erythematous. Neck: No stridor.   Cardiovascular: Normal rate, regular  rhythm. Good peripheral circulation. Grossly normal heart sounds. Respiratory: Normal respiratory effort.  No retractions. Lungs CTAB. Gastrointestinal: Left upper quadrant tenderness to palpation. No distention. Musculoskeletal: No lower extremity tenderness nor edema. No gross deformities of extremities. Neurologic:  Normal speech and language. No gross focal neurologic deficits are appreciated.  Skin:  Skin is warm, dry and intact. No rash noted. Psychiatric: Mood and affect are normal. Speech and behavior are normal.  ____________________________________________   LABS (all labs ordered are listed, but only abnormal results are displayed)  Labs Reviewed  CBC - Abnormal; Notable for the following:       Result Value   WBC 11.1 (*)    All other components within normal limits  BASIC METABOLIC PANEL - Abnormal; Notable for the following:    Glucose, Bld 127 (*)    Calcium 8.8 (*)    All other components within normal limits  URINALYSIS, COMPLETE (UACMP) WITH MICROSCOPIC - Abnormal; Notable for the following:    Color, Urine YELLOW (*)    APPearance HAZY (*)    Squamous Epithelial / LPF 0-5 (*)    All other components within normal limits  POC URINE PREG, ED  POCT PREGNANCY, URINE   ____________________________________________  EKG  ED ECG REPORT I, [place] Nicanor Mendolia, the attending physician, personally viewed and interpreted this ECG.   Date: 10/19/2016  EKG Time: 4:21AM  Rate: 67  Rhythm: Normal Sinus Rhythm  Axis: Normal  Intervals:Normal  ST&T Change: None  ____________________________________________  RADIOLOGY I, [place] Yazleen Molock, personally viewed and evaluated these images (plain radiographs) as part of my medical decision making, as well as reviewing the written report by the radiologist.  Ct Abdomen Pelvis W Contrast  Result Date: 10/19/2016 CLINICAL DATA:  Left upper and lower quadrant abdominal pain. Vomiting. EXAM: CT ABDOMEN AND PELVIS WITH CONTRAST  TECHNIQUE: Multidetector CT imaging of the abdomen and pelvis was performed using the standard protocol following bolus administration of intravenous contrast. CONTRAST:  ISOVUE-300 IOPAMIDOL (ISOVUE-300) INJECTION 61% COMPARISON:  None. FINDINGS: The images were obtained is in a delayed phase, because the patient began vomiting immediately following contrast injection. Lower chest: No pulmonary nodules. No visible pleural or pericardial effusion. Hepatobiliary: Normal hepatic size and contours without focal liver lesion. No perihepatic ascites. No intra- or extrahepatic biliary dilatation. There is cholelithiasis without evidence of acute inflammation. Pancreas: Normal pancreatic contours and enhancement. No peripancreatic fluid collection or pancreatic ductal dilatation. Spleen: Normal. Adrenals/Urinary Tract: Normal adrenal glands. No hydronephrosis or solid renal mass. Stomach/Bowel: No abnormal bowel dilatation. No bowel wall thickening or adjacent fat stranding to indicate acute inflammation. No abdominal fluid collection. Normal appendix. Vascular/Lymphatic: Normal course and caliber of the major abdominal vessels. No abdominal or pelvic adenopathy. Reproductive: Normal uterus and ovaries. Musculoskeletal: No lytic or blastic osseous lesion. Normal visualized extrathoracic and extraperitoneal soft tissues. Other: No contributory non-categorized findings. IMPRESSION: 1. No acute abnormality of the abdomen  or pelvis. 2. Cholelithiasis without other evidence of acute cholecystitis. Electronically Signed   By: Deatra Robinson M.D.   On: 10/19/2016 05:40     Procedures   ____________________________________________   INITIAL IMPRESSION / ASSESSMENT AND PLAN / ED COURSE  Pertinent labs & imaging results that were available during my care of the patient were reviewed by me and considered in my medical decision making (see chart for details).  Patient given 4 mg morphine and Zofran. History of  physical exam concern for possible gastroenteritis versus potential other abdominal pathology. As such CT scan of the abdomen and pelvis performed which revealed cholelithiasis and no other acute findings.       ____________________________________________  FINAL CLINICAL IMPRESSION(S) / ED DIAGNOSES  Final diagnoses:  Nausea vomiting and diarrhea  Calculus of gallbladder without cholecystitis without obstruction     MEDICATIONS GIVEN DURING THIS VISIT:  Medications  morphine 4 MG/ML injection 4 mg (4 mg Intravenous Given 10/19/16 0450)  ondansetron (ZOFRAN) injection 4 mg (4 mg Intravenous Given 10/19/16 0449)  iopamidol (ISOVUE-300) 61 % injection 100 mL (100 mLs Intravenous Contrast Given 10/19/16 0518)  morphine 4 MG/ML injection 4 mg (4 mg Intravenous Given 10/19/16 0536)  sodium chloride 0.9 % bolus 1,000 mL (1,000 mLs Intravenous New Bag/Given 10/19/16 0500)     NEW OUTPATIENT MEDICATIONS STARTED DURING THIS VISIT:  New Prescriptions   No medications on file    Modified Medications   No medications on file    Discontinued Medications   No medications on file     Note:  This document was prepared using Dragon voice recognition software and may include unintentional dictation errors.    Darci Current, MD 10/19/16 1027    Darci Current, MD 10/29/16 [id number]

## 2016-10-19 NOTE — ED Triage Notes (Signed)
Pt in with co mid abd pain that started at 0200, also co n.v.d.

## 2016-10-19 NOTE — ED Notes (Signed)
Pt states she is not currently breast feeding her child, per alert on medication adm.

## 2016-10-19 NOTE — ED Notes (Addendum)
Pt denies hx of kidney stones or recent fevers. Pt appears uncomfortable and states "I need to move." Pt assisted to side of bed, informed need to start IV, pt verbalized understanding of this.

## 2016-10-19 NOTE — ED Notes (Signed)
Pt ambulated to the BR without difficulty. Pt states her pain has decreased and that she "feels fine" and rates pain 5 out of 10.

## 2016-10-19 NOTE — ED Notes (Signed)
Patient transported to CT 

## 2016-10-24 ENCOUNTER — Telehealth: Payer: Self-pay

## 2016-10-24 NOTE — Telephone Encounter (Signed)
FMLA documents came in for the patient.   I have called her and left her a message. She has an upcoming appointment on 10/26/16 with Dr. Earlene Plater.   I let her know in the message that she can either make the payment over the phone or when she comes in for her visit.   I also stated that we can not fax over the documents until the payment has been collected.

## 2016-10-25 NOTE — Telephone Encounter (Signed)
I understand that patient has an appointment scheduled on 10/26/2016 but this doesn't mean that she will need surgery. She is coming in for a consult. So if Dr. Earlene Plater believes that she needs surgery, then I could fill out her FMLA form after she has it done. So you should collect her money after she has surgery.

## 2016-10-26 ENCOUNTER — Encounter: Payer: Self-pay | Admitting: Surgery

## 2016-10-26 ENCOUNTER — Ambulatory Visit (INDEPENDENT_AMBULATORY_CARE_PROVIDER_SITE_OTHER): Payer: 59 | Admitting: Surgery

## 2016-10-26 VITALS — BP 126/84 | HR 88 | Temp 98.4°F | Ht 61.0 in | Wt 189.0 lb

## 2016-10-26 DIAGNOSIS — K8020 Calculus of gallbladder without cholecystitis without obstruction: Secondary | ICD-10-CM

## 2016-10-26 NOTE — Patient Instructions (Signed)
We have seen you in office today for a follow-up visit from your emergency room visit on 10/19/16 Cholelithiasis.  Due to currently not having pain or discomfort we ask that you follow-up with [place] if you need.  If you find that eating meat, cheese, diary, and/or fried foods causes this pain to come back and/or get worse. Please avoid these foods and give our office a call.

## 2016-10-26 NOTE — Progress Notes (Signed)
Surgical Clinic History and Physical  Referring provider:  [facility] [address] [place], [place] [address]  HISTORY OF PRESENT ILLNESS (HPI):  31 y.o. [name] presents for evaluation and management of cholelithiasis identified on recent CT abdomen and pelvis performed at [facility] ED to which she presented 10/19/2016 (and previously 01/11/2016) for nausea and vomiting. At the time of her first presentation, she denied any abdominal pain and was diagnosed and discharged home with viral gastritis. She reports having experienced some mild RUQ abdominal pain at the time of her second ED presentation for primarily N/V, but she denies any additional history of abdominal pain (postprandial, RUQ/epigastric, or otherwise). Patient likewise denies any fever/chills, CP, or SOB and reports regular flatus and BM with complete resolution of her nausea/emesis symptoms without recurrence.  PAST MEDICAL HISTORY (PMH):  Past Medical History:  Diagnosis Date  . Depression   . Medical history non-contributory      PAST SURGICAL HISTORY (PSH):  Past Surgical History:  Procedure Laterality Date  . CESAREAN SECTION N/A 07/19/2015   Procedure: CESAREAN SECTION;  Surgeon: Christeen Douglas, MD;  Location: [facility] ORS;  Service: Obstetrics;  Laterality: N/A;  . WISDOM TOOTH EXTRACTION       MEDICATIONS:  Prior to Admission medications   Not on File     ALLERGIES:  No Known Allergies   SOCIAL HISTORY:  Social History   Social History  . Marital status: Single    Spouse name: N/A  . Number of children: N/A  . Years of education: N/A   Occupational History  . Not on file.   Social History Main Topics  . Smoking status: Former Smoker    Packs/day: 0.25    Years: 10.00    Quit date: 10/2015  . Smokeless tobacco: Never Used  . Alcohol use No  . Drug use: No  . Sexual activity: Yes    Birth control/ protection: Pill   Other Topics Concern  . Not on file   Social History Narrative   . No narrative on file    The patient currently resides (home / rehab facility / nursing home): Home  The patient normally is (ambulatory / bedbound) : Ambulatory   FAMILY HISTORY:  Family History  Problem Relation Age of Onset  . Hypertension Mother   . Diabetes Maternal Grandmother     Otherwise negative/non-contributory.  REVIEW OF SYSTEMS:  Constitutional: denies any other weight loss, fever, chills, or sweats  Eyes: denies any other vision changes, history of eye injury  ENT: denies sore throat, hearing problems  Respiratory: denies shortness of breath, wheezing  Cardiovascular: denies chest pain, palpitations  Gastrointestinal: abdominal pain, N/V, and bowel function as per HPI Musculoskeletal: denies any other joint pains or cramps  Skin: Denies any other rashes or skin discolorations  Neurological: denies any other headache, dizziness, weakness  Psychiatric: Denies any other depression, anxiety   All other review of systems were otherwise negative   VITAL SIGNS:  @     Height:  (154.9 cm) Weight: 189 lb (85.7 kg) BMI (Calculated): 35.8   PHYSICAL EXAM:  Constitutional:  -- Moderate obesity -- Awake, alert, and oriented x3  Eyes:  -- Pupils equally round and reactive to light  -- No scleral icterus  Ear, nose, throat:  -- No jugular venous distension -- No nasal drainage, bleeding Pulmonary:  -- No crackles  -- Equal breath sounds bilaterally -- Breathing non-labored at rest Cardiovascular:  -- S1, S2 present  -- No pericardial  rubs  Gastrointestinal:  -- Abdomen soft, nontender, nondistended, no guarding/rebound  -- No abdominal masses appreciated, pulsatile or otherwise  Musculoskeletal and Integumentary:  -- Wounds or skin discoloration: None -- Extremities: B/L UE and LE FROM, hands and feet warm, no edema  Neurologic:  -- Motor function: Intact and symmetric -- Sensation: Intact and symmetric  Labs:  CBC:  Lab Results  Component  Value Date   WBC 11.1 (H) 10/19/2016   RBC 4.45 10/19/2016   CMP Latest Ref Rng & Units 10/19/2016 01/11/2016 07/19/2015  Glucose 65 - 99 mg/dL 161(W) 960(A) 540(J)  BUN 6 - 20 mg/dL Creatinine 0.44 - 1.00 mg/dL 8.11 9.14 7.82  Sodium 135 - 145 mmol/L 138 138 137  Potassium 3.5 - 5.1 mmol/L 3.5 3.3(L) 3.5  Chloride 101 - 111 mmol/L 105 105 107  CO2 22 - 32 mmol/L Calcium 8.9 - 10.3 mg/dL 9.5(A) 2.1(H) 8.3(L)  Total Protein 6.5 - 8.1 g/dL - 7.7 -  Total Bilirubin 0.3 - 1.2 mg/dL - 0.8 -  Alkaline Phos [place] - 126 U/L - 44 -  AST 15 - 41 U/L - 16 -  ALT 14 - 54 U/L - 8(L) -     Imaging studies:  CT Abdomen and Pelvis with Contrast (10/19/2016) 1. No acute abnormality of the abdomen or pelvis. 2. Cholelithiasis without other evidence of acute cholecystitis.   Assessment/Plan:  31 y.o. [name] with completely asymptomatic vs nearly asymptomatic cholelithiasis without cholecystitis, complicated by co-morbidities including obesity (BMI 36) and history of tobacco abuse.   - no indication for surgical intervention   - causes of and risk factors for biliary dysfunction discussed with patient  - advise heart healthy diet without restrictions unless low-/non-fat diet if develops symptomatic cholelithiasis  - return to clinic as needed  All of the above recommendations were discussed with patient, and all of patient's questions were answered to her expressed satisfaction.  Thank you for the opportunity to participate in this patient's care.  -- Scherrie Gerlach Earlene Plater, MD, RPVI [place]: [facility] General Surgery - Partnering for exceptional care. Office: [phone]

## 2016-10-31 NOTE — Telephone Encounter (Signed)
Patient will not be needing surgery at this time

## 2017-10-26 DIAGNOSIS — R600 Localized edema: Secondary | ICD-10-CM | POA: Diagnosis not present

## 2017-11-30 DIAGNOSIS — M542 Cervicalgia: Secondary | ICD-10-CM | POA: Diagnosis not present

## 2017-11-30 DIAGNOSIS — S161XXA Strain of muscle, fascia and tendon at neck level, initial encounter: Secondary | ICD-10-CM | POA: Diagnosis not present

## 2017-11-30 DIAGNOSIS — M25511 Pain in right shoulder: Secondary | ICD-10-CM | POA: Diagnosis not present

## 2018-05-01 IMAGING — CT CT ABD-PELV W/ CM
2 of 5 series · 17 of 46 positions shown, 19 images · IV contrast (APPLIED)
Comparison: None.

CLINICAL DATA: Left upper and lower quadrant abdominal pain.
Vomiting.

EXAM:
CT ABDOMEN AND PELVIS WITH CONTRAST
TECHNIQUE: Multidetector CT imaging of the abdomen and pelvis was performed
using the standard protocol following bolus administration of
intravenous contrast.
CONTRAST:  100mL W57MDO-[id number] IOPAMIDOL (W57MDO-5KK) INJECTION 61%

[Series 2: routine abd/pel with · axial · 0.86mm/px · z∈[-1064,-714]mm · 14 of 78 slices shown, 16 images]
[im 4/78  soft-tissue]
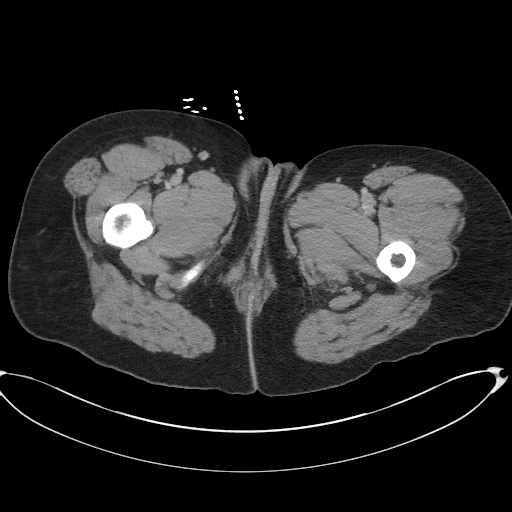
[im 4/78  bone]
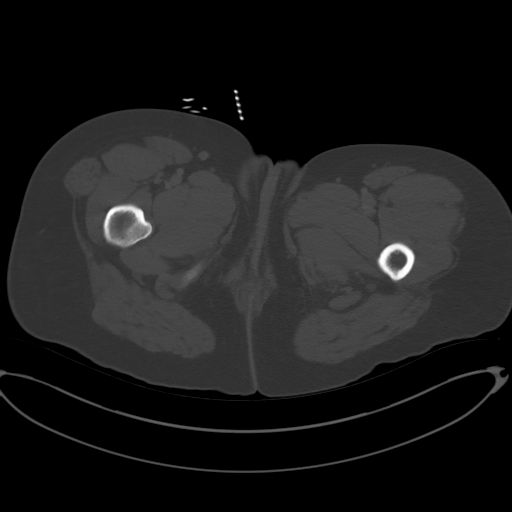
[im 12/78  soft-tissue]
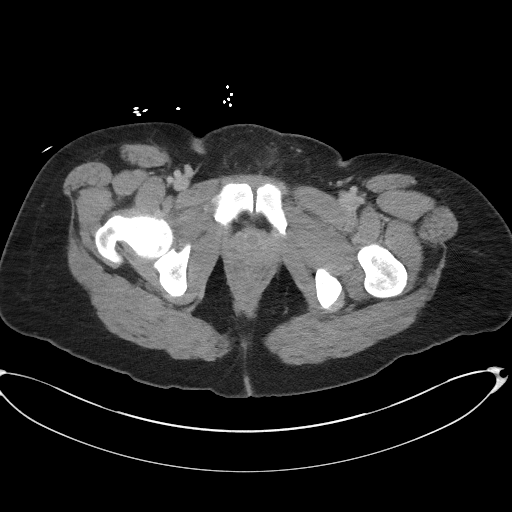
[im 15/78  soft-tissue]
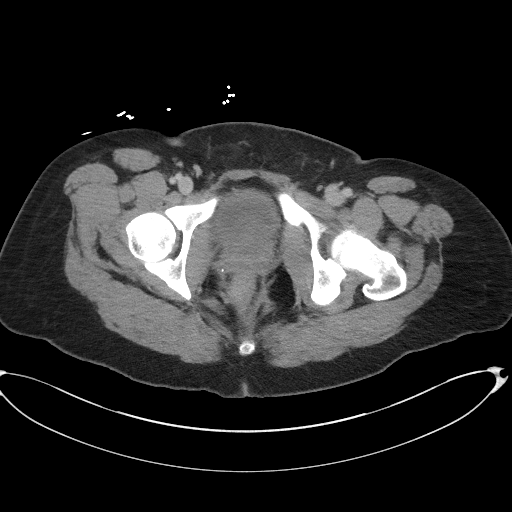
[im 23/78  soft-tissue]
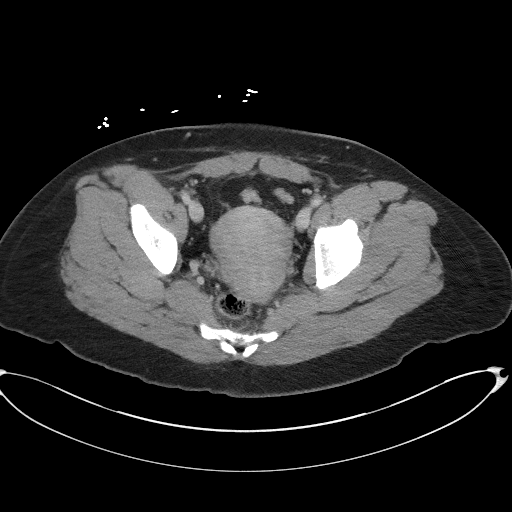
[im 26/78  soft-tissue]
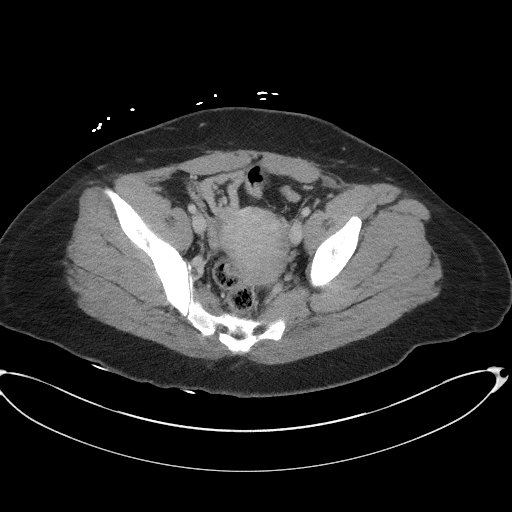
[im 30/78  soft-tissue]
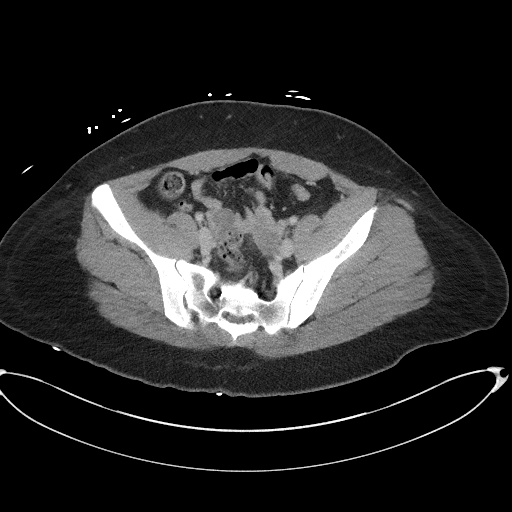
[im 37/78  soft-tissue]
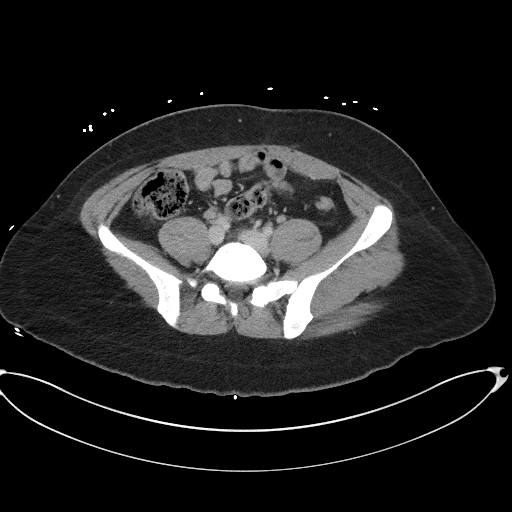
[im 41/78  soft-tissue]
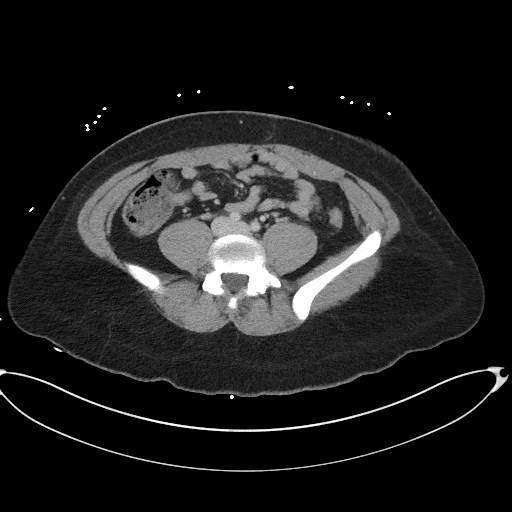
[im 48/78  soft-tissue]
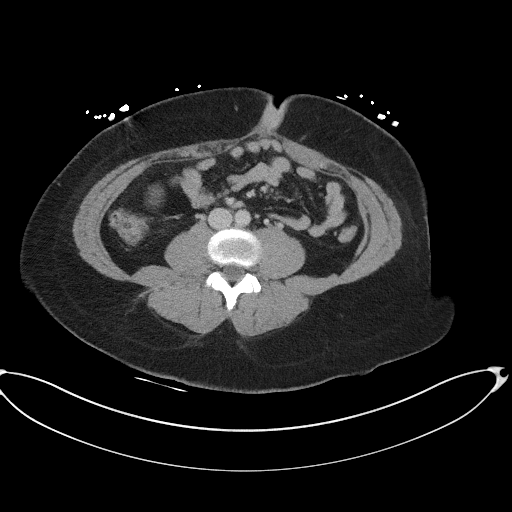
[im 48/78  bone]
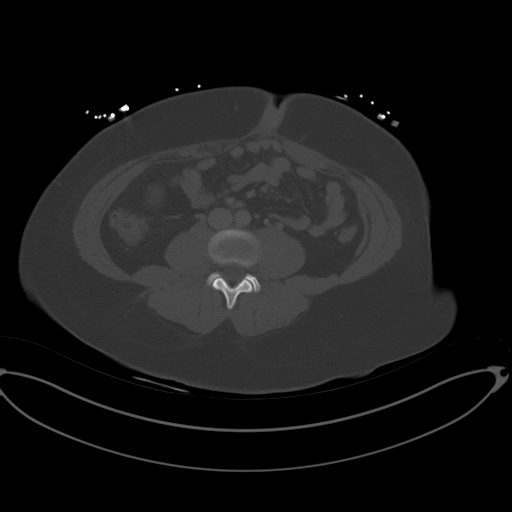
[im 52/78  soft-tissue]
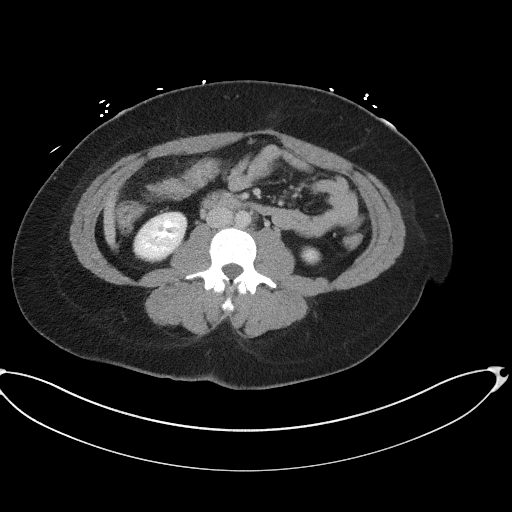
[im 59/78  soft-tissue]
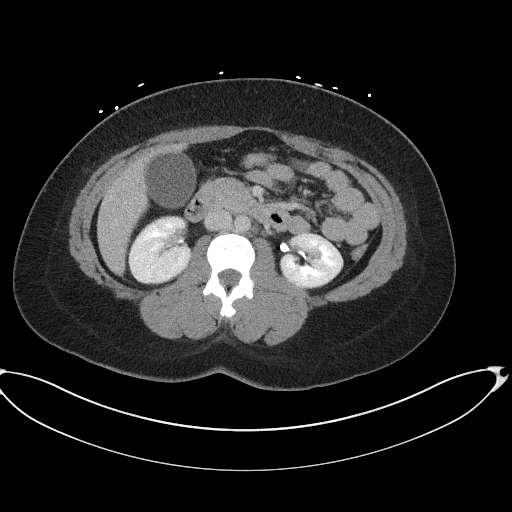
[im 63/78  soft-tissue]
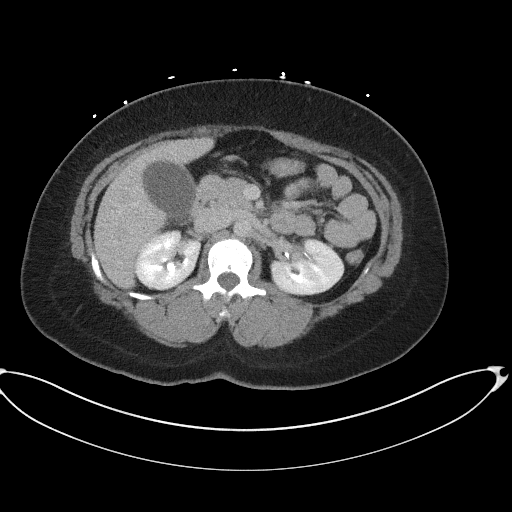
[im 67/78  soft-tissue]
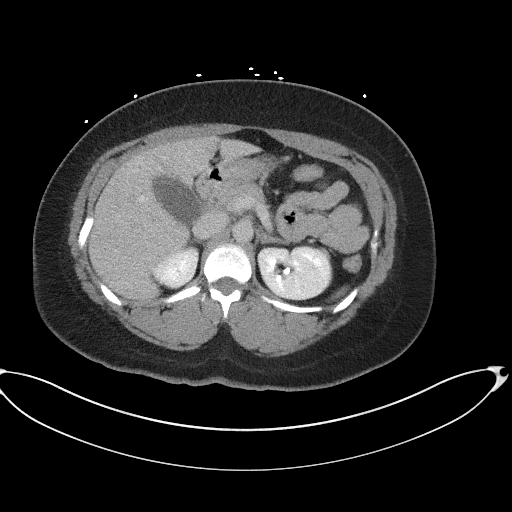
[im 74/78  soft-tissue]
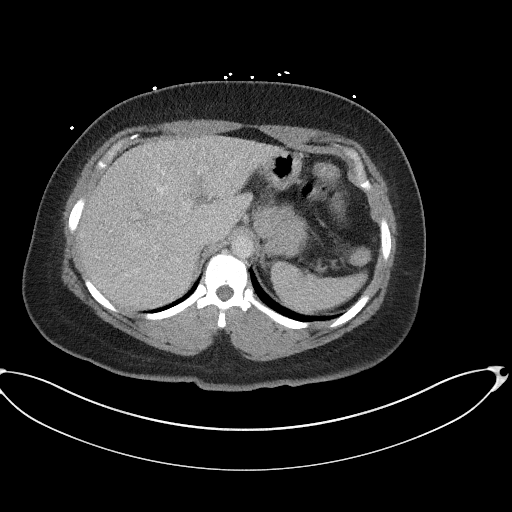

[Series 7: coronal st · coronal · 0.79mm/px · 3 of 87 slices shown]
[im 29/87  soft-tissue]
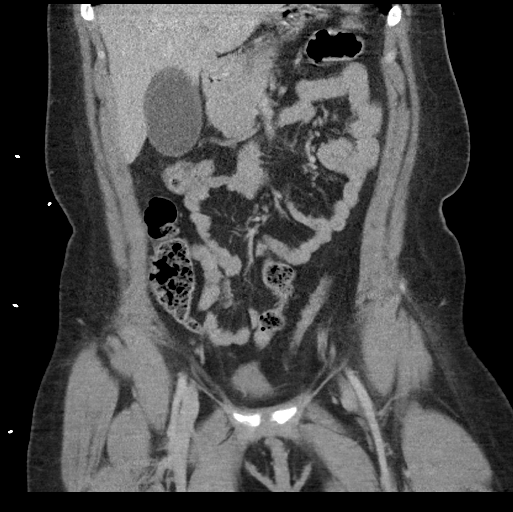
[im 39/87  soft-tissue]
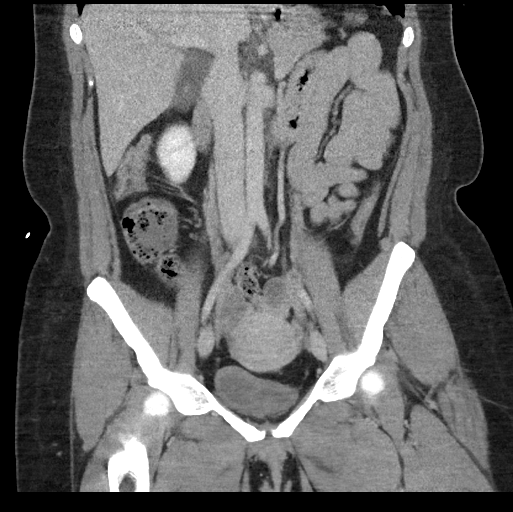
[im 48/87  soft-tissue]
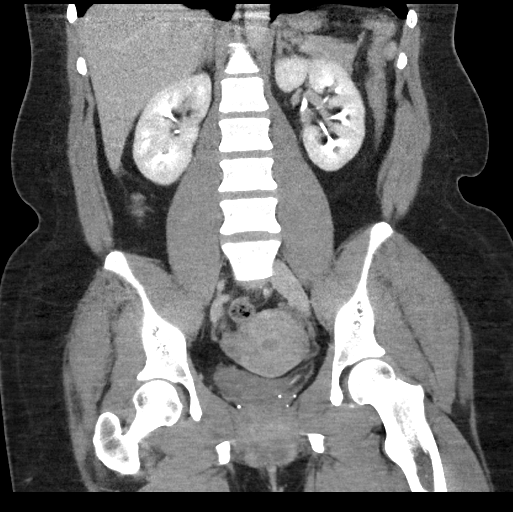

[17 of 46 positions shown; findings below may reference images not displayed]

FINDINGS: The images were obtained is in a delayed phase, because the patient
began vomiting immediately following contrast injection.

Lower chest: No pulmonary nodules. No visible pleural or pericardial
effusion.

Hepatobiliary: Normal hepatic size and contours without focal liver
lesion. No perihepatic ascites. No intra- or extrahepatic biliary
dilatation. There is cholelithiasis without evidence of acute
inflammation.

Pancreas: Normal pancreatic contours and enhancement. No
peripancreatic fluid collection or pancreatic ductal dilatation.

Spleen: Normal.

Adrenals/Urinary Tract: Normal adrenal glands. No hydronephrosis or
solid renal mass.

Stomach/Bowel: No abnormal bowel dilatation. No bowel wall
thickening or adjacent fat stranding to indicate acute inflammation.
No abdominal fluid collection. Normal appendix.

Vascular/Lymphatic: Normal course and caliber of the major abdominal
vessels. No abdominal or pelvic adenopathy.

Reproductive: Normal uterus and ovaries.

Musculoskeletal: No lytic or blastic osseous lesion. Normal
visualized extrathoracic and extraperitoneal soft tissues.

Other: No contributory non-categorized findings.
IMPRESSION: 1. No acute abnormality of the abdomen or pelvis.
2. Cholelithiasis without other evidence of acute cholecystitis.

## 2020-07-15 ENCOUNTER — Other Ambulatory Visit: Payer: Self-pay

## 2020-07-15 ENCOUNTER — Ambulatory Visit
Admission: EM | Admit: 2020-07-15 | Discharge: 2020-07-15 | Disposition: A | Discharge status: Home / Self Care | Payer: BC Managed Care – PPO | Attending: Sports Medicine | Admitting: Sports Medicine

## 2020-07-15 DIAGNOSIS — Z5321 Procedure and treatment not carried out due to patient leaving prior to being seen by health care provider: Secondary | ICD-10-CM | POA: Diagnosis not present

## 2020-07-15 DIAGNOSIS — R519 Headache, unspecified: Secondary | ICD-10-CM | POA: Insufficient documentation

## 2020-07-15 DIAGNOSIS — Z20822 Contact with and (suspected) exposure to covid-19: Secondary | ICD-10-CM | POA: Diagnosis not present

## 2020-07-15 NOTE — ED Triage Notes (Signed)
Patient complains of headache, cough and body aches x Monday. States that daughter is positive for covid and is currently present with patient.

## 2020-07-16 LAB — SARS CORONAVIRUS 2 (TAT 6-24 HRS): SARS Coronavirus 2: NEGATIVE
